# Patient Record
Sex: Female | Born: 1960 | Race: White | Hispanic: No | Smoking: Never smoker
Health system: Southern US, Community
[De-identification: ages and names within clinical notes are randomized; demographics above are authoritative.]

## PROBLEM LIST (undated history)

## (undated) DIAGNOSIS — K219 Gastro-esophageal reflux disease without esophagitis: Secondary | ICD-10-CM

## (undated) DIAGNOSIS — F419 Anxiety disorder, unspecified: Secondary | ICD-10-CM

## (undated) DIAGNOSIS — F32A Depression, unspecified: Secondary | ICD-10-CM

## (undated) HISTORY — DX: Depression, unspecified: F32.A

## (undated) HISTORY — PX: TUBAL LIGATION: SHX77

## (undated) HISTORY — DX: Anxiety disorder, unspecified: F41.9

---

## 2006-04-18 DIAGNOSIS — N959 Unspecified menopausal and perimenopausal disorder: Secondary | ICD-10-CM | POA: Insufficient documentation

## 2006-04-18 DIAGNOSIS — Z7289 Other problems related to lifestyle: Secondary | ICD-10-CM | POA: Insufficient documentation

## 2006-04-18 DIAGNOSIS — Z789 Other specified health status: Secondary | ICD-10-CM | POA: Insufficient documentation

## 2008-04-18 DIAGNOSIS — F4320 Adjustment disorder, unspecified: Secondary | ICD-10-CM | POA: Insufficient documentation

## 2011-04-03 ENCOUNTER — Ambulatory Visit: Payer: Self-pay | Admitting: Obstetrics & Gynecology

## 2011-04-03 LAB — CBC
HGB: 12.9 g/dL (ref 12.0–16.0)
MCH: 28.6 pg (ref 26.0–34.0)
MCV: 88 fL (ref 80–100)
RBC: 4.52 10*6/uL (ref 3.80–5.20)

## 2011-04-12 ENCOUNTER — Ambulatory Visit: Payer: Self-pay | Admitting: Obstetrics & Gynecology

## 2011-04-13 LAB — HEMOGLOBIN: HGB: 11.9 g/dL — ABNORMAL LOW (ref 12.0–16.0)

## 2011-04-15 LAB — PATHOLOGY REPORT

## 2011-05-03 HISTORY — PX: ABDOMINAL HYSTERECTOMY: SHX81

## 2012-04-17 ENCOUNTER — Ambulatory Visit: Payer: Self-pay | Admitting: Unknown Physician Specialty

## 2012-05-05 ENCOUNTER — Other Ambulatory Visit: Payer: Self-pay | Admitting: *Deleted

## 2012-05-05 DIAGNOSIS — Q438 Other specified congenital malformations of intestine: Secondary | ICD-10-CM

## 2012-05-05 DIAGNOSIS — K5730 Diverticulosis of large intestine without perforation or abscess without bleeding: Secondary | ICD-10-CM

## 2014-05-30 ENCOUNTER — Other Ambulatory Visit: Payer: Self-pay | Admitting: Gastroenterology

## 2014-05-30 DIAGNOSIS — Q438 Other specified congenital malformations of intestine: Secondary | ICD-10-CM

## 2014-05-30 DIAGNOSIS — K635 Polyp of colon: Secondary | ICD-10-CM

## 2014-05-31 HISTORY — PX: COLONOSCOPY: SHX174

## 2014-06-15 ENCOUNTER — Encounter (INDEPENDENT_AMBULATORY_CARE_PROVIDER_SITE_OTHER): Payer: Self-pay

## 2014-06-15 ENCOUNTER — Ambulatory Visit
Admission: RE | Admit: 2014-06-15 | Discharge: 2014-06-15 | Disposition: A | Discharge status: Home / Self Care | Payer: BLUE CROSS/BLUE SHIELD | Source: Ambulatory Visit | Attending: Gastroenterology | Admitting: Gastroenterology

## 2014-06-15 DIAGNOSIS — Q438 Other specified congenital malformations of intestine: Secondary | ICD-10-CM

## 2014-06-15 DIAGNOSIS — K635 Polyp of colon: Secondary | ICD-10-CM

## 2014-07-24 NOTE — Op Note (Signed)
PATIENT NAME:  Kayla Washington, Kayla Washington MR#:  [id number] DATE OF BIRTH:  1960/05/17  DATE OF PROCEDURE:  04/12/2011  PREOPERATIVE DIAGNOSES:  1. Fibroid uterus.  2. Left dermoid cyst of the ovary.  3. Cervical dysplasia.  4. Irregular periods. 5. Pelvic pain.   POSTOPERATIVE DIAGNOSES:  1. Fibroid uterus.  2. Left dermoid cyst of the ovary.  3. Cervical dysplasia.  4. Irregular periods. 5. Pelvic pain.   PROCEDURES PERFORMED:  1. Complete laparoscopic hysterectomy, left salpingo-oophorectomy.  2. Lysis of adhesions.   SURGEON: Dierdre Searles, MD  ASSISTANT: Dr. Patton Salles  ANESTHESIA: General by Dr. Pernell Dupre.   ESTIMATED BLOOD LOSS: 25 mL.   COMPLICATIONS: None.   FINDINGS: Patient has a left dermoid cyst. The right ovary was normal. The appendix was normal. There were minimal adhesions but there were some adhesions of the left adnexa, colon and side wall.    DISPOSITION: To recovery room in stable condition.   TECHNIQUE: Patient is prepped and draped in the usual sterile fashion after adequate anesthesia is obtained in the dorsal lithotomy position. A Foley catheter is inserted. A regular V-care device is placed on the cervix and the uterus is sounded to 6 cm.   Attention is then turned to the abdomen where a Veress needle is inserted after Marcaine 0.5% is used to anesthetize the skin just inferior to the umbilicus. Veress needle placement is confirmed using the hanging drop technique and the abdomen is then insufflated with CO2 gas. A 5 mm trocar is then inserted under direct visualization with the laparoscope with no injuries or bleeding noted. Patient is placed in Trendelenburg positioning and the above-mentioned findings are visualized.   An 11 mm trocar is placed in the right lower quadrant and a 5 mm trocar is placed in the left lower quadrant lateral to the inferior epigastric blood vessels with no injuries or bleeding noted. The uterus is grasped with a tenaculum. The right  uterine ovarian blood vessels and ligaments are carefully coagulated and cut with preservation of the main blood supply to the right ovary. Dissection is carried down to the level of the uterine arteries, which are carefully coagulated using bipolar cautery device and incised. On the left side, the adhesions are carefully dissected using the Harmonic scalpel. The infundibulopelvic blood vessel ligaments are carefully coagulated and cut in order to completely remove the left tube and ovary along with the uterine specimen. Dissection is carried down to the level of the uterine arteries, which are carefully coagulated using bipolar cautery device. The V-care device is identified through the cervical vaginal tissues at that junction and carefully incised in a circumferential manner until the uterus is completely amputated. It is removed vaginally.   The vaginal cuff is then closed with a Polysorb suture using the Endo Stitch device in an interrupted fashion, five sutures are placed and bimanual examination reveals adequate and complete closure with no loss of pneumoperitoneum and no defects. The pelvic cavity is irrigated with aspiration of all fluid. Excellent hemostasis is noted.   Patient is leveled, gas is expelled. Trocars are removed. Skin incisions are clean. 2-0 Vicryl sutures placed in the right lower quadrant incision to assist with hemostasis. The skin is closed with Dermabond. Bandages are applied. Patient goes to recovery room in stable condition. All sponge, instrument, and needle counts are correct.  ____________________________ R. Annamarie Major, MD rph:cms D: 04/12/2011 10:31:50 ET T: 04/12/2011 13:33:38 ET  JOB#: [id number] cc: Dierdre Searles, MD, <Dictator> Symphani Eckstrom P Abbott Jasinski  MD ELECTRONICALLY SIGNED 04/13/2011 23:06

## 2014-10-11 ENCOUNTER — Encounter: Payer: Self-pay | Admitting: Family Medicine

## 2014-10-11 ENCOUNTER — Ambulatory Visit (INDEPENDENT_AMBULATORY_CARE_PROVIDER_SITE_OTHER): Payer: BLUE CROSS/BLUE SHIELD | Admitting: Family Medicine

## 2014-10-11 VITALS — BP 110/72 | HR 88 | Temp 97.9°F | Resp 16 | Ht 62.0 in | Wt 174.0 lb

## 2014-10-11 DIAGNOSIS — B977 Papillomavirus as the cause of diseases classified elsewhere: Secondary | ICD-10-CM | POA: Insufficient documentation

## 2014-10-11 DIAGNOSIS — M1990 Unspecified osteoarthritis, unspecified site: Secondary | ICD-10-CM | POA: Diagnosis not present

## 2014-10-11 DIAGNOSIS — F32A Depression, unspecified: Secondary | ICD-10-CM | POA: Insufficient documentation

## 2014-10-11 DIAGNOSIS — F419 Anxiety disorder, unspecified: Secondary | ICD-10-CM | POA: Insufficient documentation

## 2014-10-11 MED ORDER — DOXYCYCLINE HYCLATE 100 MG PO TABS: 100.0000 mg | ORAL_TABLET | Freq: Two times a day (BID) | ORAL | Status: DC

## 2014-10-11 NOTE — Progress Notes (Signed)
Subjective:    Patient ID: Kayla Washington, female    DOB: 1960-05-22, 54 y.o.   MRN: [id number]  HPI  Joint/Muscle Pain: Patient complains of arthralgias for which has been present for 3 days. Pain is located in both knee(s) and both ankle(s), is described as aching and tingling, and is constant .  Associated symptoms include: decreased range of motion and warmth.  The patient has tried NSAIDs for pain, with minimal relief.  Related to injury:   no. Pt states she "pulled a tick off" of herself on 06/30. Pt also reports she has experienced fatigue and diarrhea. Denies fever.  Has not had symptoms like this before.  Atypical for her.   Review of Systems  Constitutional: Positive for activity change and fatigue. Negative for fever.  Respiratory: Negative for cough and shortness of breath.   Cardiovascular: Negative for chest pain, palpitations and leg swelling.  Gastrointestinal: Positive for diarrhea.  Musculoskeletal: Positive for myalgias and arthralgias. Negative for back pain, joint swelling, gait problem, neck pain and neck stiffness.   BP 110/72 mmHg  Pulse 88  Temp(Src) 97.9 F (36.6 C) (Oral)  Resp 16  Ht  (1.575 m)  Wt 174 lb (78.926 kg)  BMI 31.82 kg/m2   Patient Active Problem List   Diagnosis Date Noted  . Anxiety 10/11/2014  . 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 Alcohol drinker 04/18/2006  . Menopausal and perimenopausal disorder 04/18/2006   No past medical history on file. No current outpatient prescriptions on file prior to visit.   No current facility-administered medications on file prior to visit.   No Known Allergies Past Surgical History  Procedure Laterality Date  . Abdominal hysterectomy  05/2011    Still has left ovary;  Dr. Arvil Chaco  . Tubal ligation    . Colonoscopy  05/2014   History   Social History  . Marital Status: Single    Spouse Name: N/A  . Number of Children: 2  .  Years of Education: H/S   Occupational History  . 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 Smoking status: Never Smoker   . Smokeless tobacco: Never Used  . Alcohol Use: Yes     Comment: Occasional  . Drug Use: No  . Sexual Activity: Not on file   Other Topics Concern  . Not on file   Social History Narrative   Family History  Problem Relation Age of Onset  . Diabetes Mother   . Colon polyps Mother   . Heart attack Father   . Throat cancer Father   . Healthy Brother   . Hypertension Other   . Hyperlipidemia Other   . Heart disease Other   . Diabetes Other       Objective:   Physical Exam  Constitutional: She is oriented to person, place, and time. She appears well-developed and well-nourished.  Cardiovascular: Normal rate and regular rhythm.   Pulmonary/Chest: Effort normal and breath sounds normal.  Musculoskeletal: Normal range of motion. She exhibits no edema or tenderness.  Neurological: She is alert and oriented to person, place, and time.    BP 110/72 mmHg  Pulse 88  Temp(Src) 97.9 F (36.6 C) (Oral)  Resp 16  Ht  (1.575 m)  Wt 174 lb (78.926 kg)  BMI 31.82 kg/m2      Assessment & Plan:  1. Arthritis Very concerned about  Lyme Disease. Just does not feel right. Will treat presumptively.  Please call back if condition worsens or does not continue to improve.     Will treat with Doxycyline for 2 weeks.

## 2014-11-04 ENCOUNTER — Ambulatory Visit: Payer: BLUE CROSS/BLUE SHIELD | Admitting: Family Medicine

## 2014-11-04 ENCOUNTER — Encounter: Payer: Self-pay | Admitting: Family Medicine

## 2014-11-04 ENCOUNTER — Ambulatory Visit (INDEPENDENT_AMBULATORY_CARE_PROVIDER_SITE_OTHER): Payer: BLUE CROSS/BLUE SHIELD | Admitting: Family Medicine

## 2014-11-04 VITALS — BP 120/80 | HR 76 | Temp 98.3°F | Resp 16 | Wt 171.0 lb

## 2014-11-04 DIAGNOSIS — M1990 Unspecified osteoarthritis, unspecified site: Secondary | ICD-10-CM | POA: Insufficient documentation

## 2014-11-04 DIAGNOSIS — R0602 Shortness of breath: Secondary | ICD-10-CM | POA: Diagnosis not present

## 2014-11-04 DIAGNOSIS — R609 Edema, unspecified: Secondary | ICD-10-CM

## 2014-11-04 MED ORDER — HYDROCHLOROTHIAZIDE 12.5 MG PO TABS: 12.5000 mg | ORAL_TABLET | Freq: Every day | ORAL | Status: DC

## 2014-11-04 NOTE — Progress Notes (Signed)
Subjective:    Patient ID: Kayla Washington, female    DOB: [birth year], 54 y.o.   MRN: [id number]  HPI  Joint/Muscle Pain: Patient complains of arthralgias for which has been present for 3 weeks. Pain is located in multiple joints and both ankle(s), is described as aching, throbbing and tight band, and is moderate .  Associated symptoms include: edema, erythema, tenderness and warmth.  The patient has tried NSAIDs for pain, with minimal relief.  Related to injury:   no. Pt was recently seen for tick bite. Pt was put on Doxy for possible Lyme Disease due to arthralgias. Pt admits she did not finish the Doxy. Pt is now experiencing edema and erythema. Meds did make her sick. No better.  Feels like her knees and ankles and toes feel bad.   Review of Systems  Constitutional: Positive for fatigue. Negative for fever, chills, diaphoresis, activity change, appetite change and unexpected weight change.  Respiratory: Negative for cough, chest tightness, shortness of breath and wheezing.   Cardiovascular: Positive for leg swelling. Negative for chest pain and palpitations.  Musculoskeletal: Positive for joint swelling and arthralgias.  Skin: Positive for color change.   BP 120/80 mmHg  Pulse 76  Temp(Src) 98.3 F (36.8 C) (Oral)  Resp 16  Wt 171 lb (77.565 kg)   Patient Active Problem List   Diagnosis Date Noted  . Anxiety 10/11/2014  . 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 Alcohol drinker 04/18/2006  . Menopausal and perimenopausal disorder 04/18/2006   No past medical history on file. Current Outpatient Prescriptions on File Prior to Visit  Medication Sig  . famotidine (PEPCID AC MAXIMUM STRENGTH) 20 MG tablet Take by mouth.  . Omega-3 Fatty Acids (FISH OIL) 1200 MG CAPS Take by mouth.  . sertraline (ZOLOFT) 100 MG tablet Take 50 mg by mouth daily.   Marland Kitchen doxycycline (VIBRA-TABS) 100 MG tablet Take 1 tablet (100 mg total) by mouth 2 (two)  times daily. (Patient not taking: Reported on 11/04/2014)   No current facility-administered medications on file prior to visit.   No Known Allergies Past Surgical History  Procedure Laterality Date  . Abdominal hysterectomy  05/2011    Still has left ovary;  Dr. Ammie Dalton  . Tubal ligation    . Colonoscopy  05/2014   History   Social History  . Marital Status: Single    Spouse Name: N/A  . Number of Children: 2  . Years of Education: H/S   Occupational History  . 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 Smoking status: Never Smoker   . Smokeless tobacco: Never Used  . Alcohol Use: Yes     Comment: Occasional  . Drug Use: No  . Sexual Activity: Not on file   Other Topics Concern  . Not on file   Social History Narrative   Family History  Problem Relation Age of Onset  . Diabetes Mother   . Colon polyps Mother   . Heart attack Father   . Throat cancer Father   . Healthy Brother   . Hypertension Other   . Hyperlipidemia Other   . Heart disease Other   . Diabetes Other       Objective:   Physical Exam  Constitutional: She is oriented to person, place, and time. She appears well-developed and well-nourished.  Cardiovascular: Normal rate and regular rhythm.   Pulmonary/Chest: Effort normal  and breath sounds normal.  Musculoskeletal: Normal range of motion. She exhibits edema (in lower extremities below knee bilaterally.  ).  Neurological: She is alert and oriented to person, place, and time.  Psychiatric: She has a normal mood and affect. Her behavior is normal. Judgment and thought content normal.  BP 120/80 mmHg  Pulse 76  Temp(Src) 98.3 F (36.8 C) (Oral)  Resp 16  Wt 171 lb (77.565 kg)     Assessment & Plan:  1. Arthritis Did not finish medication for possible lyme secondary to side effects.  Will check labs and make further plan pending these results.  - Sed Rate (ESR) - Comprehensive metabolic panel -  Rheumatoid Factor - CYCLIC CITRUL PEPTIDE ANTIBODY, IGG/IGA - B. Burgdorfi Antibodies  2. Edema Unchanged. Will check labs pending these results. Will also start HCTZ.  - CBC with Differential/Platelet - D-Dimer, Quantitative - B Nat Peptide  3. Shortness of breath Check labs. Further plan pending these results.  - D-Dimer, Quantitative - B Nat Peptide  Margarita Rana, MD

## 2014-11-06 LAB — COMPREHENSIVE METABOLIC PANEL
ALK PHOS: 108 IU/L (ref 39–117)
ALT: 25 IU/L (ref 0–32)
AST: 22 IU/L (ref 0–40)
Albumin/Globulin Ratio: 2 (ref 1.1–2.5)
Albumin: 4.6 g/dL (ref 3.5–5.5)
BILIRUBIN TOTAL: 0.2 mg/dL (ref 0.0–1.2)
BUN/Creatinine Ratio: 16 (ref 9–23)
BUN: 12 mg/dL (ref 6–24)
CALCIUM: 9.9 mg/dL (ref 8.7–10.2)
CO2: 28 mmol/L (ref 18–29)
Chloride: 98 mmol/L (ref 97–108)
Creatinine, Ser: 0.73 mg/dL (ref 0.57–1.00)
GFR calc Af Amer: 108 mL/min/{1.73_m2} (ref >59)
GFR calc non Af Amer: 94 mL/min/{1.73_m2} (ref >59)
GLOBULIN, TOTAL: 2.3 g/dL (ref 1.5–4.5)
GLUCOSE: 95 mg/dL (ref 65–99)
Potassium: 4.1 mmol/L (ref 3.5–5.2)
SODIUM: 140 mmol/L (ref 134–144)
Total Protein: 6.9 g/dL (ref 6.0–8.5)

## 2014-11-06 LAB — CBC WITH DIFFERENTIAL/PLATELET
BASOS ABS: 0 10*3/uL (ref 0.0–0.2)
Basos: 0 %
EOS (ABSOLUTE): 0.2 10*3/uL (ref 0.0–0.4)
EOS: 3 %
Hematocrit: 37.9 % (ref 34.0–46.6)
Hemoglobin: 12.6 g/dL (ref 11.1–15.9)
Immature Grans (Abs): 0 10*3/uL (ref 0.0–0.1)
Immature Granulocytes: 0 %
Lymphocytes Absolute: 1.8 10*3/uL (ref 0.7–3.1)
Lymphs: 24 %
MCH: 27.9 pg (ref 26.6–33.0)
MCHC: 33.2 g/dL (ref 31.5–35.7)
MCV: 84 fL (ref 79–97)
Monocytes Absolute: 0.7 10*3/uL (ref 0.1–0.9)
Monocytes: 9 %
NEUTROS ABS: 4.9 10*3/uL (ref 1.4–7.0)
Neutrophils: 64 %
Platelets: 353 10*3/uL (ref 150–379)
RBC: 4.52 x10E6/uL (ref 3.77–5.28)
RDW: 14.3 % (ref 12.3–15.4)
WBC: 7.7 10*3/uL (ref 3.4–10.8)

## 2014-11-06 LAB — D-DIMER, QUANTITATIVE: D-DIMER: 1.42 mg/L FEU — ABNORMAL HIGH (ref 0.00–0.49)

## 2014-11-06 LAB — SEDIMENTATION RATE: SED RATE: 11 mm/h (ref 0–40)

## 2014-11-06 LAB — B. BURGDORFI ANTIBODIES

## 2014-11-06 LAB — BRAIN NATRIURETIC PEPTIDE: BNP: 8.4 pg/mL (ref 0.0–100.0)

## 2014-11-06 LAB — RHEUMATOID FACTOR: Rhuematoid fact SerPl-aCnc: 8.3 IU/mL (ref 0.0–13.9)

## 2014-11-06 LAB — CYCLIC CITRUL PEPTIDE ANTIBODY, IGG/IGA: CYCLIC CITRULLIN PEPTIDE AB: 5 U (ref 0–19)

## 2014-11-07 ENCOUNTER — Other Ambulatory Visit: Payer: Self-pay | Admitting: Obstetrics & Gynecology

## 2014-11-07 ENCOUNTER — Ambulatory Visit
Admission: RE | Admit: 2014-11-07 | Discharge: 2014-11-07 | Disposition: A | Discharge status: Home / Self Care | Payer: BLUE CROSS/BLUE SHIELD | Source: Ambulatory Visit | Attending: Family Medicine | Admitting: Family Medicine

## 2014-11-07 ENCOUNTER — Telehealth: Payer: Self-pay | Admitting: Family Medicine

## 2014-11-07 DIAGNOSIS — R609 Edema, unspecified: Secondary | ICD-10-CM | POA: Diagnosis not present

## 2014-11-07 NOTE — Telephone Encounter (Signed)
Pt advised.   Thanks,   -Nykolas Bacallao  

## 2014-11-07 NOTE — Addendum Note (Signed)
Addended by: Leo Grosser on: 11/07/2014 09:12 AM   Modules accepted: Orders

## 2014-11-07 NOTE — Telephone Encounter (Signed)
Pt called saying she went for the [place] on her leg which was neg. But is still having sweeling in her feet.  Please advise.  Thanks, [employer]

## 2014-11-07 NOTE — Telephone Encounter (Signed)
Needs to continue fluid pill, stay well hydrated, elevate as tolerated.  Call if does not improve and can write rx  For prescription support hose, but they can get really hot in the summer. Thanks.

## 2014-11-17 ENCOUNTER — Telehealth: Payer: Self-pay | Admitting: Family Medicine

## 2014-11-17 DIAGNOSIS — R609 Edema, unspecified: Secondary | ICD-10-CM

## 2014-11-17 NOTE — Telephone Encounter (Signed)
See below please. 

## 2014-11-17 NOTE — Telephone Encounter (Signed)
This is a Dr. Elease Hashimoto pt and pt is aware Dr. Elease Hashimoto is out of the office this week. Pt stated that she has been taking hydrochlorothiazide (HYDRODIURIL) 12.5 MG tablet for her feet swelling. Pt stated that she will have been on the medication for 2 weeks tomorrow and it hasn't helped. Pt stated that she has been elevating her legs but they are still swollen and hurt. Pt stated that she hasn't been able to wear the support hose because its too hot. Pt wanted to know if she should try something different. [facility]acy: [facility]. Please advise. Thanks TNP

## 2014-11-18 MED ORDER — FUROSEMIDE 20 MG PO TABS: 20.0000 mg | ORAL_TABLET | Freq: Every day | ORAL | Status: DC | PRN

## 2014-11-18 NOTE — Telephone Encounter (Signed)
Stop HCTZ and you can call in furosemide 20 mg every morning as needed, #15 with no refills. She needs to follow-up with Dr. Elease Hashimoto in the next 2-3 weeks to see how this problem is.

## 2014-11-18 NOTE — Telephone Encounter (Signed)
Left message to call back.  Thanks, 

## 2014-11-18 NOTE — Telephone Encounter (Signed)
Sent prescription to pharmacy as directed below.  Thanks,

## 2014-11-18 NOTE — Telephone Encounter (Signed)
Advised pt as directed below, pt verbalized fully understanding. Pt requested for the prescription to be sent to [facility] [address] in [place],  Thanks,

## 2014-11-24 ENCOUNTER — Telehealth: Payer: Self-pay | Admitting: Family Medicine

## 2014-11-24 ENCOUNTER — Ambulatory Visit (INDEPENDENT_AMBULATORY_CARE_PROVIDER_SITE_OTHER): Payer: BLUE CROSS/BLUE SHIELD | Admitting: Family Medicine

## 2014-11-24 ENCOUNTER — Encounter: Payer: Self-pay | Admitting: Family Medicine

## 2014-11-24 VITALS — BP 124/76 | HR 68 | Temp 98.0°F | Resp 16 | Wt 170.0 lb

## 2014-11-24 DIAGNOSIS — R609 Edema, unspecified: Secondary | ICD-10-CM | POA: Diagnosis not present

## 2014-11-24 MED ORDER — FUROSEMIDE 40 MG PO TABS: 40.0000 mg | ORAL_TABLET | Freq: Every day | ORAL | Status: DC

## 2014-11-24 MED ORDER — POTASSIUM CHLORIDE CRYS ER 20 MEQ PO TBCR: 20.0000 meq | EXTENDED_RELEASE_TABLET | Freq: Every day | ORAL | Status: DC

## 2014-11-24 NOTE — Progress Notes (Signed)
Patient ID: Kayla Washington, female   DOB: 08/01/1960, 54 y.o.   MRN: [id number]        Patient: Kayla Washington Female    DOB: September 29, 1960   54 y.o.   MRN: [id number] Visit Date: 11/24/2014  Today's Provider: Lorie Phenix, MD   Chief Complaint  Patient presents with  . Edema   Subjective:    HPI  Edema: Patient complains of edema. The location of the edema is lower leg(s) bilateral.  The edema has been off and on.  Onset of symptoms was 2 months ago, unchanged since that time. The edema is present in the evening.  The swelling has been aggravated by rest, relieved by nothing, and been associated with nothing. Cardiac risk factors include none.  Pt reported last week she stopped the HCTZ and started Lasix  a day.  She reports the medication change has helped some, but is still having a lot of trouble with her feet swelling, pain and stiffness.  This occurs especially after working when she is home resting.         No Known Allergies Previous Medications   FAMOTIDINE (PEPCID AC MAXIMUM STRENGTH) 20 MG TABLET    Take by mouth.   FUROSEMIDE (LASIX) 20 MG TABLET    Take 1 tablet (20 mg total) by mouth daily as needed.   MULTIPLE VITAMINS-MINERALS (CENTRUM SILVER ULTRA WOMENS PO)    Take by mouth.   OMEGA-3 FATTY ACIDS (FISH OIL) 1200 MG CAPS    Take by mouth.   SERTRALINE (ZOLOFT) 100 MG TABLET    Take 50 mg by mouth daily.     Review of Systems  Constitutional: Negative.   Respiratory: Negative.   Cardiovascular: Positive for leg swelling. Negative for chest pain and palpitations.  Musculoskeletal: Positive for myalgias (Muscle pain and stiffness.), joint swelling and arthralgias. Negative for back pain, gait problem, neck pain and neck stiffness.    Social History  Substance Use Topics  . Smoking status: Never Smoker   . Smokeless tobacco: Never Used  . Alcohol Use: Yes     Comment: Occasional   Objective:   BP 124/76 mmHg  Pulse 68  Temp(Src) 98 F (36.7 C)  (Oral)  Resp 16  Wt 170 lb (77.111 kg)  Physical Exam  Constitutional: She is oriented to person, place, and time. She appears well-developed and well-nourished.  Cardiovascular: Normal rate and regular rhythm.   Pulmonary/Chest: Effort normal and breath sounds normal.  Musculoskeletal: She exhibits edema (trace).  Neurological: She is alert and oriented to person, place, and time.      Assessment & Plan:     1. Edema Not really improved. Will increase Lasix, check labs, and add potassium.   Consider Zaroxolyn if this does not work. Will consider support hose when cooler.  Recheck in 4 weeks.  - potassium chloride SA (K-DUR,KLOR-CON) 20 MEQ tablet; Take 1 tablet (20 mEq total) by mouth daily.  Dispense: 30 tablet; Refill: 3 - furosemide (LASIX) 40 MG tablet; Take 1 tablet (40 mg total) by mouth daily.  Dispense: 30 tablet; Refill: 3 - Comprehensive metabolic panel  Lorie Phenix, MD        Lorie Phenix, MD  [facility]

## 2014-11-25 ENCOUNTER — Telehealth: Payer: Self-pay

## 2014-11-25 LAB — COMPREHENSIVE METABOLIC PANEL
ALT: 33 IU/L — ABNORMAL HIGH (ref 0–32)
AST: 26 IU/L (ref 0–40)
Albumin/Globulin Ratio: 1.6 (ref 1.1–2.5)
Albumin: 4.1 g/dL (ref 3.5–5.5)
Alkaline Phosphatase: 112 IU/L (ref 39–117)
BUN/Creatinine Ratio: 13 (ref 9–23)
BUN: 10 mg/dL (ref 6–24)
Bilirubin Total: 0.3 mg/dL (ref 0.0–1.2)
CHLORIDE: 98 mmol/L (ref 97–108)
CO2: 27 mmol/L (ref 18–29)
Calcium: 9.8 mg/dL (ref 8.7–10.2)
Creatinine, Ser: 0.75 mg/dL (ref 0.57–1.00)
GFR, EST AFRICAN AMERICAN: 105 mL/min/{1.73_m2} (ref >59)
GFR, EST NON AFRICAN AMERICAN: 91 mL/min/{1.73_m2} (ref >59)
GLUCOSE: 95 mg/dL (ref 65–99)
Globulin, Total: 2.6 g/dL (ref 1.5–4.5)
Potassium: 4.7 mmol/L (ref 3.5–5.2)
Sodium: 141 mmol/L (ref 134–144)
TOTAL PROTEIN: 6.7 g/dL (ref 6.0–8.5)

## 2014-11-25 NOTE — Telephone Encounter (Signed)
Left message to call back  

## 2014-11-25 NOTE — Telephone Encounter (Signed)
Advised patient of labs 

## 2014-11-25 NOTE — Telephone Encounter (Signed)
-----   Message from Lorie Phenix, MD sent at 11/25/2014  9:58 AM EDT ----- Labs stable. Please notify patient. Thanks.

## 2014-12-22 ENCOUNTER — Ambulatory Visit (INDEPENDENT_AMBULATORY_CARE_PROVIDER_SITE_OTHER): Payer: BLUE CROSS/BLUE SHIELD | Admitting: Family Medicine

## 2014-12-22 ENCOUNTER — Encounter: Payer: Self-pay | Admitting: Family Medicine

## 2014-12-22 VITALS — BP 114/68 | HR 68 | Temp 98.2°F | Resp 16 | Wt 170.0 lb

## 2014-12-22 DIAGNOSIS — R609 Edema, unspecified: Secondary | ICD-10-CM | POA: Diagnosis not present

## 2014-12-22 NOTE — Progress Notes (Signed)
Patient ID: Kayla Washington, female   DOB: Sep 22, 1960, 54 y.o.   MRN: [id number]        Patient: Kayla Washington Female    DOB: Apr 11, 1960   54 y.o.   MRN: [id number] Visit Date: 12/22/2014  Today's Provider: Lorie Phenix, MD   Chief Complaint  Patient presents with  . Edema   Subjective:    HPI  Edema: Patient complains of edema. The location of the edema is lower leg(s) bilateral.  The edema has been moderate.  Onset of symptoms was a few months ago, completely resolved since that time. The edema is present in the evening. The patient states never.  The swelling has been aggravated by hot weather and standing at work, and steel toes shoes. , relieved by diuretics, and been associated with nothing. Cardiac risk factors include none.      No Known Allergies Previous Medications   FAMOTIDINE (PEPCID AC MAXIMUM STRENGTH) 20 MG TABLET    Take by mouth.   FUROSEMIDE (LASIX) 40 MG TABLET    Take 1 tablet (40 mg total) by mouth daily.   MULTIPLE VITAMINS-MINERALS (CENTRUM SILVER ULTRA WOMENS PO)    Take by mouth.   OMEGA-3 FATTY ACIDS (FISH OIL) 1200 MG CAPS    Take by mouth.   POTASSIUM CHLORIDE SA (K-DUR,KLOR-CON) 20 MEQ TABLET    Take 1 tablet (20 mEq total) by mouth daily.   SERTRALINE (ZOLOFT) 100 MG TABLET    Take 50 mg by mouth daily.     Review of Systems  Constitutional: Negative.   Respiratory: Negative.   Cardiovascular: Negative.   Musculoskeletal: Negative.   Neurological: Negative.     Social History  Substance Use Topics  . Smoking status: Never Smoker   . Smokeless tobacco: Never Used  . Alcohol Use: Yes     Comment: Occasional   Objective:   BP 114/68 mmHg  Pulse 68  Temp(Src) 98.2 F (36.8 C) (Oral)  Resp 16  Wt 170 lb (77.111 kg)  Physical Exam  Constitutional: She is oriented to person, place, and time. She appears well-developed and well-nourished.  Musculoskeletal: Normal range of motion. She exhibits no edema.  Neurological: She is  alert and oriented to person, place, and time.      Assessment & Plan:        1. Edema Continue medication for now. Change to PRN when weather changes. Check labs.   - Comprehensive metabolic panel  Lorie Phenix, MD  [facility]

## 2014-12-23 ENCOUNTER — Telehealth: Payer: Self-pay

## 2014-12-23 LAB — COMPREHENSIVE METABOLIC PANEL
ALT: 28 IU/L (ref 0–32)
AST: 23 IU/L (ref 0–40)
Albumin/Globulin Ratio: 1.5 (ref 1.1–2.5)
Albumin: 4.2 g/dL (ref 3.5–5.5)
Alkaline Phosphatase: 102 IU/L (ref 39–117)
BUN/Creatinine Ratio: 18 (ref 9–23)
BUN: 13 mg/dL (ref 6–24)
Bilirubin Total: 0.3 mg/dL (ref 0.0–1.2)
CALCIUM: 9.8 mg/dL (ref 8.7–10.2)
CO2: 27 mmol/L (ref 18–29)
CREATININE: 0.74 mg/dL (ref 0.57–1.00)
Chloride: 97 mmol/L (ref 97–108)
GFR calc Af Amer: 106 mL/min/{1.73_m2} (ref >59)
GFR, EST NON AFRICAN AMERICAN: 92 mL/min/{1.73_m2} (ref >59)
Globulin, Total: 2.8 g/dL (ref 1.5–4.5)
Glucose: 92 mg/dL (ref 65–99)
Potassium: 3.7 mmol/L (ref 3.5–5.2)
Sodium: 140 mmol/L (ref 134–144)
Total Protein: 7 g/dL (ref 6.0–8.5)

## 2014-12-23 NOTE — Telephone Encounter (Signed)
-----   Message from Lorie Phenix, MD sent at 12/23/2014  9:42 AM EDT ----- Labs normal.  Thanks.

## 2014-12-23 NOTE — Telephone Encounter (Signed)
Pt informed and voiced understanding of results. 

## 2014-12-23 NOTE — Telephone Encounter (Signed)
LMTCB Emily Drozdowski, CMA  

## 2015-04-10 ENCOUNTER — Encounter: Payer: Self-pay | Admitting: Family Medicine

## 2015-10-09 ENCOUNTER — Other Ambulatory Visit: Payer: Self-pay | Admitting: Family Medicine

## 2015-10-09 DIAGNOSIS — F419 Anxiety disorder, unspecified: Secondary | ICD-10-CM

## 2015-10-20 ENCOUNTER — Ambulatory Visit (INDEPENDENT_AMBULATORY_CARE_PROVIDER_SITE_OTHER): Payer: BLUE CROSS/BLUE SHIELD | Admitting: Family Medicine

## 2015-10-20 ENCOUNTER — Encounter: Payer: Self-pay | Admitting: Family Medicine

## 2015-10-20 VITALS — BP 124/82 | HR 60 | Temp 98.0°F | Resp 14 | Wt 178.0 lb

## 2015-10-20 DIAGNOSIS — M7021 Olecranon bursitis, right elbow: Secondary | ICD-10-CM

## 2015-10-20 DIAGNOSIS — L723 Sebaceous cyst: Secondary | ICD-10-CM

## 2015-10-20 NOTE — Progress Notes (Signed)
Patient: Kayla Washington Female    DOB: 05/15/1960   55 y.o.   MRN: [id number] Visit Date: 10/20/2015  Today's Provider: Dortha Kern, PA   Chief Complaint  Patient presents with  . Fall  . Elbow Injury   Subjective:    HPI  Patient fell at home 1 month ago by slipping on her rug and landed on her elbows. Her right elbow and arm turned blue for 1 week and then resolved. She has developed a knot on her right elbow that hurts at times. Some warmth to the area at times. She has taking some Ibuprofen. 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  Patient Active Problem List   Diagnosis Date Noted  . Arthritis 11/04/2014  . Edema 11/04/2014  . Shortness of breath 11/04/2014  . Anxiety 10/11/2014  . 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 Alcohol drinker (HCC) 04/18/2006  . Menopausal and perimenopausal disorder 04/18/2006   Past Surgical History  Procedure Laterality Date  . Abdominal hysterectomy  05/2011    Still has left ovary;  Dr. Arvil Chaco  . Tubal ligation    . Colonoscopy  05/2014   Family History  Problem Relation Age of Onset  . Diabetes Mother   . Colon polyps Mother   . Heart attack Father   . Throat cancer Father   . Healthy Brother   . Hypertension Other   . Hyperlipidemia Other   . Heart disease Other   . Diabetes Other    No Known Allergies   Current Meds  Medication Sig  . famotidine (PEPCID AC MAXIMUM STRENGTH) 20 MG tablet Take by mouth.    Review of Systems  Constitutional: Positive for chills.  Respiratory: Negative.   Cardiovascular: Negative.   Musculoskeletal: Positive for myalgias, joint swelling and arthralgias.    Social History  Substance Use Topics  . Smoking status: Never Smoker   . Smokeless tobacco: Never Used  . Alcohol Use: Yes     Comment: Occasional   Objective:   BP 124/82 mmHg  Pulse 60   Temp(Src) 98 F (36.7 C)  Resp 14  Wt 178 lb (80.74 kg)  Wt Readings from Last 3 Encounters:  10/20/15 178 lb (80.74 kg)  12/22/14 170 lb (77.111 kg)  11/24/14 170 lb (77.111 kg)   Physical Exam  Constitutional: She is oriented to person, place, and time. She appears well-developed and well-nourished. No distress.  HENT:  Head: Normocephalic and atraumatic.  Right Ear: Hearing normal.  Left Ear: Hearing normal.  Nose: Nose normal.  Eyes: Conjunctivae and lids are normal. Right eye exhibits no discharge. Left eye exhibits no discharge. No scleral icterus.  Pulmonary/Chest: Effort normal. No respiratory distress.  Musculoskeletal: Normal range of motion.  Soft fluid filled bursa left elbow. No tenderness or redness. Full ROM and good pulses. No weakness.  Neurological: She is alert and oriented to person, place, and time.  Skin: Skin is intact. No lesion and no rash noted.  1 cm firm cyst right side of neck with yellow oily fluid expressed. Not red or tender. No local lymphadenopathy.  Psychiatric: She has a normal mood and affect. Her speech is normal and behavior is normal. Thought content normal.      Assessment & Plan:     1. Olecranon bursitis of right elbow Onset over the past week.  Job involves a great deal of repetitive motion and she has fallen onto this elbow a month ago. No pain or redness. Small amount of fluid in the bursa. May use Ibuprofen 200 mg 3 tablets TID with food and an elbow sleeve for compression. Recheck if no better in 7-10 days. May need aspiration.  2. Sebaceous cyst 1 cm cyst right side of neck without redness or local lymphadenopathy. Some yellow oily fluid expressed. No tenderness. Apply moist hot compresses. Recheck if worsening or signs of infection.       Dortha Kernennis Chrismon, PA  [facility]

## 2016-05-03 ENCOUNTER — Ambulatory Visit (INDEPENDENT_AMBULATORY_CARE_PROVIDER_SITE_OTHER): Payer: BLUE CROSS/BLUE SHIELD | Admitting: Family Medicine

## 2016-05-03 ENCOUNTER — Encounter: Payer: Self-pay | Admitting: Family Medicine

## 2016-05-03 VITALS — BP 128/82 | HR 78 | Temp 98.1°F | Resp 16 | Wt 181.4 lb

## 2016-05-03 DIAGNOSIS — R079 Chest pain, unspecified: Secondary | ICD-10-CM | POA: Diagnosis not present

## 2016-05-03 DIAGNOSIS — K219 Gastro-esophageal reflux disease without esophagitis: Secondary | ICD-10-CM | POA: Diagnosis not present

## 2016-05-03 DIAGNOSIS — R202 Paresthesia of skin: Secondary | ICD-10-CM

## 2016-05-03 NOTE — Progress Notes (Signed)
Patient: Kayla Washington Female    DOB: Dec 25, 1960   56 y.o.   MRN: [id number] Visit Date: 05/03/2016  Today's Provider: Dortha Kern, PA   Chief Complaint  Patient presents with  . Hypertension   Subjective:    Hypertension  This is a new problem. The current episode started in the past 7 days. Associated symptoms include blurred vision, chest pain (last week) and headaches. (Chest pain during the night last week. Coldness and tingling in left hand) There are no known risk factors for coronary artery disease. Past treatments include nothing.   Patient Active Problem List   Diagnosis Date Noted  . Arthritis 11/04/2014  . Edema 11/04/2014  . Shortness of breath 11/04/2014  . Anxiety 10/11/2014  . 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 Alcohol drinker 04/18/2006  . Menopausal and perimenopausal disorder 04/18/2006   Past Surgical History:  Procedure Laterality Date  . ABDOMINAL HYSTERECTOMY  05/2011   Still has left ovary;  Dr. Arvil Chaco  . COLONOSCOPY  05/2014  . TUBAL LIGATION     Family History  Problem Relation Age of Onset  . Diabetes Mother   . Colon polyps Mother   . Heart attack Father   . Throat cancer Father   . Healthy Brother   . Hypertension Other   . Hyperlipidemia Other   . Heart disease Other   . Diabetes Other    No Known Allergies   Previous Medications   FAMOTIDINE (PEPCID AC MAXIMUM STRENGTH) 20 MG TABLET    Take by mouth.    Review of Systems  Constitutional: Negative.   Eyes: Positive for blurred vision and visual disturbance.  Respiratory: Negative.   Cardiovascular: Positive for chest pain (last week).  Neurological: Positive for headaches.    Social History  Substance Use Topics  . Smoking status: Never Smoker  . Smokeless tobacco: Never Used  . Alcohol use Yes     Comment: Occasional   Objective:   BP 128/82 (BP Location: Right Arm, Patient Position: Sitting, Cuff Size:  Normal)   Pulse 78   Temp 98.1 F (36.7 C) (Oral)   Resp 16   Wt 181 lb 6.4 oz (82.3 kg)   SpO2 96%   BMI 33.18 kg/m   Physical Exam  Constitutional: She is oriented to person, place, and time. She appears well-developed and well-nourished. No distress.  HENT:  Head: Normocephalic and atraumatic.  Right Ear: Hearing and external ear normal.  Left Ear: Hearing and external ear normal.  Nose: Nose normal.  Mouth/Throat: Oropharynx is clear and moist.  Eyes: Conjunctivae and lids are normal. Right eye exhibits no discharge. Left eye exhibits no discharge. No scleral icterus.  Neck: Neck supple.  Cardiovascular: Normal rate and regular rhythm.   Pulmonary/Chest: Effort normal and breath sounds normal. No respiratory distress.  Abdominal: Soft. Bowel sounds are normal.  Musculoskeletal: Normal range of motion.  Lymphadenopathy:    She has no cervical adenopathy.  Neurological: She is alert and oriented to person, place, and time.  Skin: Skin is intact. No lesion and no rash noted.  Psychiatric: Her speech is normal and behavior is normal. Thought content normal. Her mood appears anxious.      Assessment & Plan:     1. Chest pain, unspecified type Onset 04-27-16 without dyspnea, diaphoresis or palpitations. Described as a sharp pain. Did not last very long and has not recurred this week. Not related to activities. Only happened  during that night. EKG essentially unchanged from tracing in 2014. Will check labs and if having recurrences, should schedule evaluation by cardiologist with stress test. Recommend she take a Baby ASA daily. - EKG 12-Lead - CBC with Differential/Platelet - Comprehensive metabolic panel - Lipid panel  2. Paresthesias in left hand Onset 04-30-16 with a "cold sensation" and feeling cold to touch. Seems to recur when she is at work doing a repetitive motion task. States there has been some extra stress at work with job changes and a new Solicitorplant manager. Denies  cyanosis or blanching of fingers. Normal pulses and sensation today. Negative tests for CTS. May have been a Raynaud's Phenomena. Recommend she keep the hands warm and exercise regularly. Check routine labs. May need neurology referral if symptoms persist. - CBC with Differential/Platelet - Comprehensive metabolic panel - TSH  3. Gastroesophageal reflux disease, esophagitis presence not specified Continues to have dyspepsia with acidic foods and some reflux at night. Need to get back on the Prilosec daily and recheck routine labs. May need referral to gastroenterologist if symptoms persist. - CBC with Differential/Platelet

## 2016-05-03 NOTE — Patient Instructions (Signed)
Heartburn Heartburn is a type of pain or discomfort that can happen in the throat or chest. It is often described as a burning pain. It may also cause a bad taste in the mouth. Heartburn may feel worse when you lie down or bend over, and it is often worse at night. Heartburn may be caused by stomach contents that move back up into the esophagus (reflux). Follow these instructions at home: Take these actions to decrease your discomfort and to help avoid complications. Diet  Follow a diet as recommended by your health care provider. This may involve avoiding foods and drinks such as: ? Coffee and tea (with or without caffeine). ? Drinks that contain alcohol. ? Energy drinks and sports drinks. ? Carbonated drinks or sodas. ? Chocolate and cocoa. ? Peppermint and mint flavorings. ? Garlic and onions. ? Horseradish. ? Spicy and acidic foods, including peppers, chili powder, curry powder, vinegar, hot sauces, and barbecue sauce. ? Citrus fruit juices and citrus fruits, such as oranges, lemons, and limes. ? Tomato-based foods, such as red sauce, chili, salsa, and pizza with red sauce. ? Fried and fatty foods, such as donuts, french fries, potato chips, and high-fat dressings. ? High-fat meats, such as hot dogs and fatty cuts of red and white meats, such as rib eye steak, sausage, ham, and bacon. ? High-fat dairy items, such as whole milk, butter, and cream cheese.  Eat small, frequent meals instead of large meals.  Avoid drinking large amounts of liquid with your meals.  Avoid eating meals during the 2-3 hours before bedtime.  Avoid lying down right after you eat.  Do not exercise right after you eat. General instructions  Pay attention to any changes in your symptoms.  Take over-the-counter and prescription medicines only as told by your health care provider. Do not take aspirin, ibuprofen, or other NSAIDs unless your health care provider told you to do so.  Do not use any tobacco  products, including cigarettes, chewing tobacco, and e-cigarettes. If you need help quitting, ask your health care provider.  Wear loose-fitting clothing. Do not wear anything tight around your waist that causes pressure on your abdomen.  Raise (elevate) the head of your bed about 6 inches (15 cm).  Try to reduce your stress, such as with yoga or meditation. If you need help reducing stress, ask your health care provider.  If you are overweight, reduce your weight to an amount that is healthy for you. Ask your health care provider for guidance about a safe weight loss goal.  Keep all follow-up visits as told by your health care provider. This is important. Contact a health care provider if:  You have new symptoms.  You have unexplained weight loss.  You have difficulty swallowing, or it hurts to swallow.  You have wheezing or a persistent cough.  Your symptoms do not improve with treatment.  You have frequent heartburn for more than two weeks. Get help right away if:  You have pain in your arms, neck, jaw, teeth, or back.  You feel sweaty, dizzy, or light-headed.  You have chest pain or shortness of breath.  You vomit and your vomit looks like blood or coffee grounds.  Your stool is bloody or black. This information is not intended to replace advice given to you by your health care provider. Make sure you discuss any questions you have with your health care provider. Document Released: 08/04/2008 Document Revised: 08/24/2015 Document Reviewed: 07/13/2014 Elsevier Interactive Patient Education  2017 Elsevier   Inc.  

## 2016-05-17 DIAGNOSIS — R748 Abnormal levels of other serum enzymes: Secondary | ICD-10-CM | POA: Diagnosis not present

## 2016-05-17 DIAGNOSIS — R202 Paresthesia of skin: Secondary | ICD-10-CM | POA: Diagnosis not present

## 2016-05-17 DIAGNOSIS — K219 Gastro-esophageal reflux disease without esophagitis: Secondary | ICD-10-CM | POA: Diagnosis not present

## 2016-05-17 DIAGNOSIS — R079 Chest pain, unspecified: Secondary | ICD-10-CM | POA: Diagnosis not present

## 2016-05-18 LAB — CBC WITH DIFFERENTIAL/PLATELET
BASOS: 1 %
Basophils Absolute: 0 10*3/uL (ref 0.0–0.2)
EOS (ABSOLUTE): 0.3 10*3/uL (ref 0.0–0.4)
Eos: 6 %
Hematocrit: 37.5 % (ref 34.0–46.6)
Hemoglobin: 12.9 g/dL (ref 11.1–15.9)
Immature Grans (Abs): 0 10*3/uL (ref 0.0–0.1)
Immature Granulocytes: 0 %
LYMPHS ABS: 1.4 10*3/uL (ref 0.7–3.1)
Lymphs: 31 %
MCH: 28.1 pg (ref 26.6–33.0)
MCHC: 34.4 g/dL (ref 31.5–35.7)
MCV: 82 fL (ref 79–97)
MONOS ABS: 0.4 10*3/uL (ref 0.1–0.9)
Monocytes: 10 %
NEUTROS ABS: 2.4 10*3/uL (ref 1.4–7.0)
Neutrophils: 52 %
Platelets: 294 10*3/uL (ref 150–379)
RBC: 4.59 x10E6/uL (ref 3.77–5.28)
RDW: 15.3 % (ref 12.3–15.4)
WBC: 4.6 10*3/uL (ref 3.4–10.8)

## 2016-05-18 LAB — LIPID PANEL
CHOL/HDL RATIO: 3.2 ratio (ref 0.0–4.4)
CHOLESTEROL TOTAL: 221 mg/dL — AB (ref 100–199)
HDL: 69 mg/dL (ref >39)
LDL Calculated: 134 mg/dL — ABNORMAL HIGH (ref 0–99)
TRIGLYCERIDES: 88 mg/dL (ref 0–149)
VLDL Cholesterol Cal: 18 mg/dL (ref 5–40)

## 2016-05-18 LAB — COMPREHENSIVE METABOLIC PANEL
A/G RATIO: 1.8 (ref 1.2–2.2)
ALK PHOS: 112 IU/L (ref 39–117)
ALT: 89 IU/L — ABNORMAL HIGH (ref 0–32)
AST: 56 IU/L — ABNORMAL HIGH (ref 0–40)
Albumin: 4.2 g/dL (ref 3.5–5.5)
BUN / CREAT RATIO: 16 (ref 9–23)
BUN: 11 mg/dL (ref 6–24)
Bilirubin Total: 0.4 mg/dL (ref 0.0–1.2)
CO2: 25 mmol/L (ref 18–29)
Calcium: 9.9 mg/dL (ref 8.7–10.2)
Chloride: 100 mmol/L (ref 96–106)
Creatinine, Ser: 0.68 mg/dL (ref 0.57–1.00)
GFR calc Af Amer: 113 mL/min/{1.73_m2} (ref >59)
GFR, EST NON AFRICAN AMERICAN: 98 mL/min/{1.73_m2} (ref >59)
GLOBULIN, TOTAL: 2.4 g/dL (ref 1.5–4.5)
Glucose: 99 mg/dL (ref 65–99)
POTASSIUM: 4.5 mmol/L (ref 3.5–5.2)
SODIUM: 141 mmol/L (ref 134–144)
Total Protein: 6.6 g/dL (ref 6.0–8.5)

## 2016-05-18 LAB — TSH: TSH: 2.13 u[IU]/mL (ref 0.450–4.500)

## 2016-05-19 ENCOUNTER — Encounter: Payer: Self-pay | Admitting: Family Medicine

## 2016-05-20 ENCOUNTER — Other Ambulatory Visit: Payer: Self-pay | Admitting: Emergency Medicine

## 2016-05-20 DIAGNOSIS — R748 Abnormal levels of other serum enzymes: Secondary | ICD-10-CM

## 2016-05-20 NOTE — Addendum Note (Signed)
Addended by: Latanya Presser'DELL, BRITTANY M on: 05/20/2016 09:57 AM   Modules accepted: Orders

## 2016-05-21 LAB — HEPATITIS PANEL, ACUTE
HEP A IGM: NEGATIVE
Hep B C IgM: NEGATIVE
Hepatitis B Surface Ag: NEGATIVE

## 2016-05-21 LAB — SPECIMEN STATUS REPORT

## 2016-05-22 NOTE — Progress Notes (Signed)
Advised  ED 

## 2017-06-09 ENCOUNTER — Ambulatory Visit (INDEPENDENT_AMBULATORY_CARE_PROVIDER_SITE_OTHER): Payer: BLUE CROSS/BLUE SHIELD | Admitting: Family Medicine

## 2017-06-09 ENCOUNTER — Encounter: Payer: Self-pay | Admitting: Family Medicine

## 2017-06-09 VITALS — BP 120/80 | HR 64 | Temp 98.0°F | Resp 16 | Ht 62.0 in | Wt 176.0 lb

## 2017-06-09 DIAGNOSIS — R87613 High grade squamous intraepithelial lesion on cytologic smear of cervix (HGSIL): Secondary | ICD-10-CM | POA: Diagnosis not present

## 2017-06-09 DIAGNOSIS — Z114 Encounter for screening for human immunodeficiency virus [HIV]: Secondary | ICD-10-CM

## 2017-06-09 DIAGNOSIS — Z0000 Encounter for general adult medical examination without abnormal findings: Secondary | ICD-10-CM | POA: Diagnosis not present

## 2017-06-09 DIAGNOSIS — Z23 Encounter for immunization: Secondary | ICD-10-CM

## 2017-06-09 DIAGNOSIS — R87612 Low grade squamous intraepithelial lesion on cytologic smear of cervix (LGSIL): Secondary | ICD-10-CM | POA: Insufficient documentation

## 2017-06-09 DIAGNOSIS — B977 Papillomavirus as the cause of diseases classified elsewhere: Secondary | ICD-10-CM

## 2017-06-09 DIAGNOSIS — Z1231 Encounter for screening mammogram for malignant neoplasm of breast: Secondary | ICD-10-CM

## 2017-06-09 DIAGNOSIS — N890 Mild vaginal dysplasia: Secondary | ICD-10-CM | POA: Insufficient documentation

## 2017-06-09 NOTE — Patient Instructions (Signed)
Preventive Care 40-64 Years, Female Preventive care refers to lifestyle choices and visits with your health care provider that can promote health and wellness. What does preventive care include?  A yearly physical exam. This is also called an annual well check.  Dental exams once or twice a year.  Routine eye exams. Ask your health care provider how often you should have your eyes checked.  Personal lifestyle choices, including: ? Daily care of your teeth and gums. ? Regular physical activity. ? Eating a healthy diet. ? Avoiding tobacco and drug use. ? Limiting alcohol use. ? Practicing safe sex. ? Taking low-dose aspirin daily starting at age 58. ? Taking vitamin and mineral supplements as recommended by your health care provider. What happens during an annual well check? The services and screenings done by your health care provider during your annual well check will depend on your age, overall health, lifestyle risk factors, and family history of disease. Counseling Your health care provider may ask you questions about your:  Alcohol use.  Tobacco use.  Drug use.  Emotional well-being.  Home and relationship well-being.  Sexual activity.  Eating habits.  Work and work Statistician.  Method of birth control.  Menstrual cycle.  Pregnancy history.  Screening You may have the following tests or measurements:  Height, weight, and BMI.  Blood pressure.  Lipid and cholesterol levels. These may be checked every 5 years, or more frequently if you are over 81 years old.  Skin check.  Lung cancer screening. You may have this screening every year starting at age 78 if you have a 30-pack-year history of smoking and currently smoke or have quit within the past 15 years.  Fecal occult blood test (FOBT) of the stool. You may have this test every year starting at age 65.  Flexible sigmoidoscopy or colonoscopy. You may have a sigmoidoscopy every 5 years or a colonoscopy  every 10 years starting at age 30.  Hepatitis C blood test.  Hepatitis B blood test.  Sexually transmitted disease (STD) testing.  Diabetes screening. This is done by checking your blood sugar (glucose) after you have not eaten for a while (fasting). You may have this done every 1-3 years.  Mammogram. This may be done every 1-2 years. Talk to your health care provider about when you should start having regular mammograms. This may depend on whether you have a family history of breast cancer.  BRCA-related cancer screening. This may be done if you have a family history of breast, ovarian, tubal, or peritoneal cancers.  Pelvic exam and Pap test. This may be done every 3 years starting at age 80. Starting at age 36, this may be done every 5 years if you have a Pap test in combination with an HPV test.  Bone density scan. This is done to screen for osteoporosis. You may have this scan if you are at high risk for osteoporosis.  Discuss your test results, treatment options, and if necessary, the need for more tests with your health care provider. Vaccines Your health care provider may recommend certain vaccines, such as:  Influenza vaccine. This is recommended every year.  Tetanus, diphtheria, and acellular pertussis (Tdap, Td) vaccine. You may need a Td booster every 10 years.  Varicella vaccine. You may need this if you have not been vaccinated.  Zoster vaccine. You may need this after age 5.  Measles, mumps, and rubella (MMR) vaccine. You may need at least one dose of MMR if you were born in  1958 or later. You may also need a second dose.  Pneumococcal 13-valent conjugate (PCV13) vaccine. You may need this if you have certain conditions and were not previously vaccinated.  Pneumococcal polysaccharide (PPSV23) vaccine. You may need one or two doses if you smoke cigarettes or if you have certain conditions.  Meningococcal vaccine. You may need this if you have certain  conditions.  Hepatitis A vaccine. You may need this if you have certain conditions or if you travel or work in places where you may be exposed to hepatitis A.  Hepatitis B vaccine. You may need this if you have certain conditions or if you travel or work in places where you may be exposed to hepatitis B.  Haemophilus influenzae type b (Hib) vaccine. You may need this if you have certain conditions.  Talk to your health care provider about which screenings and vaccines you need and how often you need them. This information is not intended to replace advice given to you by your health care provider. Make sure you discuss any questions you have with your health care provider. Document Released: 04/14/2015 Document Revised: 12/06/2015 Document Reviewed: 01/17/2015 Elsevier Interactive Patient Education  2018 Elsevier Inc.  

## 2017-06-09 NOTE — Assessment & Plan Note (Signed)
>>  ASSESSMENT AND PLAN FOR LGSIL ON PAP SMEAR OF CERVIX WRITTEN ON 06/09/2017  5:00 PM BY MYRLA JON HERO, MD  Needed Gyn referral after last pap smear for col[po, but ptient states this was never done Will repeat pap today and plan for GYN referral

## 2017-06-09 NOTE — Assessment & Plan Note (Signed)
Needed Gyn referral after last pap smear for col[po, but ptient states this was never done Will repeat pap today and plan for GYN referral

## 2017-06-09 NOTE — Progress Notes (Signed)
Patient: Kayla Washington, Female    DOB: 17-May-1960, 57 y.o.   MRN: [id number] Visit Date: 06/09/2017  Today's Provider: Shirlee LatchAngela Rielly Corlett, MD   I, Joslyn HyEmily Ratchford, CMA, am acting as scribe for Shirlee LatchAngela Ardelia Wrede, MD.  Chief Complaint  Patient presents with  . Annual Exam   Subjective:    Annual physical exam Kayla ChockKimberly A Parkison is a 57 y.o. female who presents today for health maintenance and complete physical. She feels poorly. She is c/o dizziness. She states her BP was checked at the eye doctor last week, and her BP was 145-102. BP is normal today. She states she is also experiencing cold sx, which she is taking OTC Cold medicine for, with some relief, although it does cause drowsiness.    She reports exercising none. She reports she is sleeping fairly well.  Last pap- 04/06/2014- HGSIL- HPV positive. States she was unaware that this was abnormal. Last colonoscopy- 04/17/2012- diverticulosis, internal hemorrhoids. Last mammogram - many years ago, not in system -----------------------------------------------------------------   Review of Systems  Constitutional: Positive for diaphoresis and fatigue. Negative for activity change, appetite change, chills, fever and unexpected weight change.  HENT: Positive for sinus pressure, sore throat and trouble swallowing. Negative for congestion, dental problem, drooling, ear discharge, ear pain, facial swelling, hearing loss, mouth sores, nosebleeds, postnasal drip, rhinorrhea, sinus pain, sneezing, tinnitus and voice change.   Eyes: Positive for visual disturbance. Negative for photophobia, pain, discharge, redness and itching.  Respiratory: Positive for cough, chest tightness and shortness of breath. Negative for apnea, choking, wheezing and stridor.   Cardiovascular: Negative.   Gastrointestinal: Positive for abdominal distention and nausea. Negative for abdominal pain, anal bleeding, blood in stool, constipation, diarrhea, rectal  pain and vomiting.  Endocrine: Positive for polydipsia and polyuria. Negative for cold intolerance, heat intolerance and polyphagia.  Genitourinary: Negative.   Musculoskeletal: Positive for arthralgias, back pain, neck pain and neck stiffness. Negative for gait problem, joint swelling and myalgias.  Skin: Negative.   Allergic/Immunologic: Negative.   Neurological: Positive for dizziness, weakness, light-headedness and numbness. Negative for tremors, seizures, syncope, facial asymmetry, speech difficulty and headaches.  Hematological: Negative for adenopathy. Bruises/bleeds easily.  Psychiatric/Behavioral: Positive for confusion. Negative for agitation, behavioral problems, decreased concentration, dysphoric mood, hallucinations, self-injury, sleep disturbance and suicidal ideas. The patient is nervous/anxious. The patient is not hyperactive.     Social History      She  reports that  has never smoked. she has never used smokeless tobacco. She reports that she drinks about 7.2 - 10.8 oz of alcohol per week. She reports that she does not use drugs.       Social History   Socioeconomic History  . Marital status: Divorced    Spouse name: None  . Number of children: 2  . Years of education: H/S  . Highest education level: High school graduate  Social Needs  . Financial resource strain: Not hard at all  . Food insecurity - worry: Never true  . Food insecurity - inability: Never true  . Transportation needs - medical: No  . Transportation needs - non-medical: No  Occupational History  . Occupation: Tree surgeonAssembler at [employer]    Comment: Full-Time  Tobacco Use  . Smoking status: Never Smoker  . Smokeless tobacco: Never Used  Substance and Sexual Activity  . Alcohol use: Yes    Alcohol/week: 7.2 - 10.8 oz    Types: 12 - 18 Cans of beer per week  Comment: drinks about 6 beers on Friday's, Saturday's and every other Thursday.  . Drug use: No  . Sexual activity: Not Currently    Other Topics Concern  . None  Social History Narrative  . None    History reviewed. No pertinent past medical history.   Patient Active Problem List   Diagnosis Date Noted  . Arthritis 11/04/2014  . Edema 11/04/2014  . Shortness of breath 11/04/2014  . Anxiety 10/11/2014  . 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 Alcohol drinker 04/18/2006  . Menopausal and perimenopausal disorder 04/18/2006    Past Surgical History:  Procedure Laterality Date  . ABDOMINAL HYSTERECTOMY  05/2011   Still has left ovary;  Dr. Arvil Chaco  . COLONOSCOPY  05/2014  . TUBAL LIGATION      Family History        Family Status  Relation Name Status  . Mother  Deceased  . Father  Deceased  . Brother  Alive  . Other  (Not Specified)        Her family history includes Colon polyps in her mother; Diabetes in her mother and other; Healthy in her brother; Heart attack in her father; Heart disease in her other; Hyperlipidemia in her other; Hypertension in her other; Throat cancer in her father.      No Known Allergies   Current Outpatient Medications:  .  aspirin 81 MG tablet, Take 81 mg by mouth daily., Disp: , Rfl:  .  omeprazole (PRILOSEC) 20 MG capsule, Take 20 mg by mouth daily., Disp: , Rfl:    Patient Care Team: Margaretann Loveless, PA-C as PCP - General (Family Medicine)      Objective:   Vitals: BP 120/80 (BP Location: Left Arm, Patient Position: Sitting, Cuff Size: Large)   Pulse 64   Temp 98 F (36.7 C) (Oral)   Resp 16   Ht 5\' 2"  (1.575 m)   Wt 176 lb (79.8 kg)   SpO2 98%   BMI 32.19 kg/m    Vitals:   06/09/17 1544  BP: 120/80  Pulse: 64  Resp: 16  Temp: 98 F (36.7 C)  TempSrc: Oral  SpO2: 98%  Weight: 176 lb (79.8 kg)  Height: 5\' 2"  (1.575 m)     Physical Exam  Constitutional: She is oriented to person, place, and time. She appears well-developed and well-nourished. No distress.  HENT:  Head: Normocephalic and  atraumatic.  Right Ear: External ear normal.  Left Ear: External ear normal.  Nose: Nose normal.  Mouth/Throat: Oropharynx is clear and moist. No oropharyngeal exudate.  Eyes: Conjunctivae and EOM are normal. Pupils are equal, round, and reactive to light. No scleral icterus.  Neck: Neck supple. No thyromegaly present.  Cardiovascular: Normal rate, regular rhythm, normal heart sounds and intact distal pulses.  No murmur heard. Pulmonary/Chest: Effort normal and breath sounds normal. No respiratory distress. She has no wheezes. She has no rales.  Abdominal: Soft. She exhibits no distension. There is no tenderness. There is no rebound.  Genitourinary:  Genitourinary Comments: Breasts: breasts appear normal, no suspicious masses, no skin or nipple changes or axillary nodes. GYN:  External genitalia within normal limits.  Vaginal mucosa pink, moist, normal rugae.  Nonfriable cervix (very posterior) without lesions, no discharge or bleeding noted on speculum exam.  Bimanual exam revealed normal, nongravid uterus.  No cervical motion tenderness. No adnexal masses bilaterally.    Musculoskeletal: She exhibits no edema.  Lymphadenopathy:  She has no cervical adenopathy.  Neurological: She is alert and oriented to person, place, and time.  Skin: Skin is warm and dry. No rash noted.  Psychiatric: She has a normal mood and affect. Her behavior is normal.  Vitals reviewed.    Depression Screen PHQ 2/9 Scores 06/09/2017  PHQ - 2 Score 4  PHQ- 9 Score 14      Assessment & Plan:     Routine Health Maintenance and Physical Exam  Exercise Activities and Dietary recommendations Goals    None      Immunization History  Administered Date(s) Administered  . Td 04/01/1997    Health Maintenance  Topic Date Due  . HIV Screening  01/29/1975  . PAP SMEAR  01/28/1981  . TETANUS/TDAP  04/02/2007  . MAMMOGRAM  01/28/2010  . INFLUENZA VACCINE  10/30/2016  . COLONOSCOPY  06/14/2024  .  Hepatitis C Screening  Completed     Discussed health benefits of physical activity, and encouraged her to engage in regular exercise appropriate for her age and condition.    --------------------------------------------------------------------  Problem List Items Addressed This Visit      Other   HPV (human papilloma virus) infection   High grade squamous intraepithelial lesion (HGSIL) on cytologic smear of cervix    Needed Gyn referral after last pap smear for col[po, but ptient states this was never done Will repeat pap today and plan for GYN referral      Relevant Orders   Pap IG and HPV (high risk) DNA detection    Other Visit Diagnoses    Encounter for annual physical exam    -  Primary   Relevant Orders   Comprehensive metabolic panel   Lipid panel   Screening for HIV (human immunodeficiency virus)       Relevant Orders   HIV antibody (with reflex)   Encounter for screening mammogram for breast cancer       Relevant Orders   MM SCREENING BREAST TOMO BILATERAL   Need for Tdap vaccination       Relevant Orders   Tdap vaccine greater than or equal to 7yo IM (Completed)       Return in about 1 year (around 06/10/2018) for physical.   The entirety of the information documented in the History of Present Illness, Review of Systems and Physical Exam were personally obtained by me. Portions of this information were initially documented by Irving Burton Ratchford, CMA and reviewed by me for thoroughness and accuracy.    Erasmo Downer, MD, MPH [facility] 06/09/2017 5:00 PM

## 2017-06-12 ENCOUNTER — Telehealth: Payer: Self-pay

## 2017-06-12 ENCOUNTER — Other Ambulatory Visit: Payer: Self-pay | Admitting: Family Medicine

## 2017-06-12 DIAGNOSIS — R87612 Low grade squamous intraepithelial lesion on cytologic smear of cervix (LGSIL): Secondary | ICD-10-CM

## 2017-06-12 LAB — PAP IG AND HPV HIGH-RISK
HPV, high-risk: POSITIVE — AB
PAP Smear Comment: 0

## 2017-06-12 NOTE — Telephone Encounter (Signed)
[name] called pt about referral. She will return pt's call.

## 2017-06-12 NOTE — Telephone Encounter (Signed)
Patient called back asking to speak with Irving BurtonEmily. She says she is returning her call again. I didn't see any open messages to give to the patient.  Patient states Irving Burtonmily called back again and left a message for her to call back.

## 2017-06-12 NOTE — Telephone Encounter (Signed)
Pt advised and agrees with plan. 

## 2017-06-12 NOTE — Telephone Encounter (Signed)
[email or web address]

## 2017-06-12 NOTE — Telephone Encounter (Signed)
-----   Message from Erasmo DownerAngela M Bacigalupo, MD sent at 06/12/2017 12:17 PM EDT ----- Pap smear shows low grade lesions.  This is slightly better than last pap smear, but still abnormal.  HPV positive.  Needs to be seen by GYN (referral placed) for colposcopy (looking more closely) and possible biopsy.  Erasmo DownerBacigalupo, Angela M, MD, MPH [facility] 06/12/2017 12:16 PM

## 2017-06-14 ENCOUNTER — Emergency Department: Payer: BLUE CROSS/BLUE SHIELD

## 2017-06-14 ENCOUNTER — Encounter: Payer: Self-pay | Admitting: Emergency Medicine

## 2017-06-14 ENCOUNTER — Other Ambulatory Visit: Payer: Self-pay

## 2017-06-14 ENCOUNTER — Emergency Department
Admission: EM | Admit: 2017-06-14 | Discharge: 2017-06-14 | Disposition: A | Discharge status: Home / Self Care | Payer: BLUE CROSS/BLUE SHIELD | Attending: Emergency Medicine | Admitting: Emergency Medicine

## 2017-06-14 DIAGNOSIS — R51 Headache: Secondary | ICD-10-CM | POA: Insufficient documentation

## 2017-06-14 DIAGNOSIS — R531 Weakness: Secondary | ICD-10-CM | POA: Insufficient documentation

## 2017-06-14 DIAGNOSIS — R112 Nausea with vomiting, unspecified: Secondary | ICD-10-CM | POA: Diagnosis not present

## 2017-06-14 DIAGNOSIS — R519 Headache, unspecified: Secondary | ICD-10-CM

## 2017-06-14 DIAGNOSIS — Z79899 Other long term (current) drug therapy: Secondary | ICD-10-CM | POA: Insufficient documentation

## 2017-06-14 DIAGNOSIS — R110 Nausea: Secondary | ICD-10-CM

## 2017-06-14 DIAGNOSIS — Z7982 Long term (current) use of aspirin: Secondary | ICD-10-CM | POA: Insufficient documentation

## 2017-06-14 HISTORY — DX: Gastro-esophageal reflux disease without esophagitis: K21.9

## 2017-06-14 LAB — CBC
HCT: 41.2 % (ref 35.0–47.0)
Hemoglobin: 13.6 g/dL (ref 12.0–16.0)
MCH: 27.6 pg (ref 26.0–34.0)
MCHC: 33.1 g/dL (ref 32.0–36.0)
MCV: 83.6 fL (ref 80.0–100.0)
PLATELETS: 345 10*3/uL (ref 150–440)
RBC: 4.93 MIL/uL (ref 3.80–5.20)
RDW: 14.5 % (ref 11.5–14.5)
WBC: 6.3 10*3/uL (ref 3.6–11.0)

## 2017-06-14 LAB — URINALYSIS, COMPLETE (UACMP) WITH MICROSCOPIC
Bacteria, UA: NONE SEEN
Bilirubin Urine: NEGATIVE
GLUCOSE, UA: NEGATIVE mg/dL
Hgb urine dipstick: NEGATIVE
KETONES UR: NEGATIVE mg/dL
Leukocytes, UA: NEGATIVE
Nitrite: NEGATIVE
Protein, ur: NEGATIVE mg/dL
SPECIFIC GRAVITY, URINE: 1.006 (ref 1.005–1.030)
pH: 6 (ref 5.0–8.0)

## 2017-06-14 LAB — COMPREHENSIVE METABOLIC PANEL
ALK PHOS: 97 U/L (ref 38–126)
ALT: 29 U/L (ref 14–54)
AST: 33 U/L (ref 15–41)
Albumin: 4.2 g/dL (ref 3.5–5.0)
Anion gap: 9 (ref 5–15)
BUN: 11 mg/dL (ref 6–20)
CHLORIDE: 105 mmol/L (ref 101–111)
CO2: 23 mmol/L (ref 22–32)
CREATININE: 0.75 mg/dL (ref 0.44–1.00)
Calcium: 9.7 mg/dL (ref 8.9–10.3)
Glucose, Bld: 118 mg/dL — ABNORMAL HIGH (ref 65–99)
Potassium: 4.2 mmol/L (ref 3.5–5.1)
SODIUM: 137 mmol/L (ref 135–145)
Total Bilirubin: 0.6 mg/dL (ref 0.3–1.2)
Total Protein: 7.6 g/dL (ref 6.5–8.1)

## 2017-06-14 LAB — LIPASE, BLOOD: Lipase: 35 U/L (ref 11–51)

## 2017-06-14 LAB — TROPONIN I: Troponin I: <0.03 ng/mL (ref <0.03)

## 2017-06-14 LAB — TSH: TSH: 1.843 u[IU]/mL (ref 0.350–4.500)

## 2017-06-14 LAB — GLUCOSE, CAPILLARY: Glucose-Capillary: 122 mg/dL — ABNORMAL HIGH (ref 65–99)

## 2017-06-14 MED ORDER — SODIUM CHLORIDE 0.9 % IV BOLUS (SEPSIS)
1000.0000 mL | Freq: Once | INTRAVENOUS | Status: AC
  Administered 2017-06-14: 1000 mL via INTRAVENOUS

## 2017-06-14 MED ORDER — ONDANSETRON HCL 4 MG PO TABS: 4.0000 mg | ORAL_TABLET | Freq: Every day | ORAL | 0 refills | Status: DC | PRN

## 2017-06-14 MED ORDER — KETOROLAC TROMETHAMINE 30 MG/ML IJ SOLN
30.0000 mg | Freq: Once | INTRAMUSCULAR | Status: AC
Start: 2017-06-14 — End: 2017-06-14
  Administered 2017-06-14: 30 mg via INTRAVENOUS
  Filled 2017-06-14: qty 1

## 2017-06-14 MED ORDER — DIPHENHYDRAMINE HCL 50 MG/ML IJ SOLN
25.0000 mg | Freq: Once | INTRAMUSCULAR | Status: AC
  Administered 2017-06-14: 25 mg via INTRAVENOUS
  Filled 2017-06-14: qty 1

## 2017-06-14 MED ORDER — PROCHLORPERAZINE EDISYLATE 5 MG/ML IJ SOLN
10.0000 mg | Freq: Once | INTRAMUSCULAR | Status: AC
  Administered 2017-06-14: 10 mg via INTRAVENOUS
  Filled 2017-06-14: qty 2

## 2017-06-14 NOTE — ED Triage Notes (Signed)
Pt c/o headache, nausea, loss of appetite x 2 weeks. Has dr appt this past Monday for physical and to back next week for blood work

## 2017-06-14 NOTE — ED Provider Notes (Signed)
[facility] Emergency Department Provider Note  ____________________________________________   None    (approximate)  I have reviewed the triage vital signs and the nursing notes.   HISTORY  Chief Complaint Nausea and Headache  HPI Kayla Washington is a 57 y.o. female with a history of HPV as well as what she describes as social drinking who is presenting to the emergency department today with 2 weeks of weakness, near-syncope now 5 out of 10 headache which is frontal and pressure-like since last night.  She says that she has been nauseous with a decreased appetite.  Says the headache has been gradual in onset.  Denies abdominal pain chest pain.  Says that she has had some photophobia.  No history of migraine headache.  Says that she has not had a drink in 2 weeks.   Past Medical History:  Diagnosis Date  . GERD (gastroesophageal reflux disease)     Patient Active Problem List   Diagnosis Date Noted  . 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 Arthritis 11/04/2014  . Anxiety 10/11/2014  . 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 Alcohol drinker 04/18/2006  . Menopausal and perimenopausal disorder 04/18/2006    Past Surgical History:  Procedure Laterality Date  . ABDOMINAL HYSTERECTOMY  05/2011   Still has left ovary;  Dr. Arvil ChacoVan Dalen  . COLONOSCOPY  05/2014  . TUBAL LIGATION      Prior to Admission medications   Medication Sig Start Date End Date Taking? Authorizing Provider  aspirin 81 MG tablet Take 81 mg by mouth daily.    [provider]  omeprazole (PRILOSEC) 20 MG capsule Take 20 mg by mouth daily.    [provider]    Allergies Patient has no known allergies.  Family History  Problem Relation Age of Onset  . Diabetes Mother   . Colon polyps Mother   . Heart attack Father   . Throat cancer Father   . Healthy Brother   . Hypertension Other   . Hyperlipidemia Other    . Heart disease Other   . Diabetes Other     Social History Social History   Tobacco Use  . Smoking status: Never Smoker  . Smokeless tobacco: Never Used  Substance Use Topics  . Alcohol use: Yes    Alcohol/week: 7.2 - 10.8 oz    Types: 12 - 18 Cans of beer per week    Comment: drinks about 6 beers on Friday's, Saturday's and every other Thursday.  . Drug use: No    Review of Systems  Constitutional: No fever/chills Eyes: No visual changes. ENT: No sore throat. Cardiovascular: Denies chest pain. Respiratory: Denies shortness of breath. Gastrointestinal: No abdominal pain.  no vomiting.  No diarrhea.  No constipation. Genitourinary: Negative for dysuria. Musculoskeletal: Negative for back pain. Skin: Negative for rash. Neurological: Negative for focal weakness or numbness.   ____________________________________________   PHYSICAL EXAM:  VITAL SIGNS: ED Triage Vitals  Enc Vitals Group     BP 06/14/17 1201 (!) 138/94     Pulse Rate 06/14/17 1201 79     Resp 06/14/17 1201 18     Temp 06/14/17 1201 98 F (36.7 C)     Temp Source 06/14/17 1201 Oral     SpO2 06/14/17 1201 97 %     Weight 06/14/17 1202 174 lb (78.9 kg)     Height 06/14/17 1202 5\' 7"  (1.702 m)  Head Circumference --      Peak Flow --      Pain Score 06/14/17 1234 5     Pain Loc --      Pain Edu? --      Excl. in GC? --     Constitutional: Alert and oriented. Well appearing and in no acute distress. Eyes: Conjunctivae are normal.  Head: Atraumatic. Nose: No congestion/rhinnorhea. Mouth/Throat: Mucous membranes are moist.  Neck: No stridor.   Cardiovascular: Normal rate, regular rhythm. Grossly normal heart sounds.   Respiratory: Normal respiratory effort.  No retractions. Lungs CTAB. Gastrointestinal: Soft and nontender. No distention.  Musculoskeletal: No lower extremity tenderness nor edema.  No joint effusions. Neurologic:  Normal speech and language. No gross focal neurologic  deficits are appreciated. Skin:  Skin is warm, dry and intact. No rash noted. Psychiatric: Mood and affect are normal. Speech and behavior are normal.  ____________________________________________   LABS (all labs ordered are listed, but only abnormal results are displayed)  Labs Reviewed  GLUCOSE, CAPILLARY - Abnormal; Notable for the following components:      Result Value   Glucose-Capillary 122 (*)    All other components within normal limits  COMPREHENSIVE METABOLIC PANEL - Abnormal; Notable for the following components:   Glucose, Bld 118 (*)    All other components within normal limits  LIPASE, BLOOD  CBC  TROPONIN I  TSH  URINALYSIS, COMPLETE (UACMP) WITH MICROSCOPIC   ____________________________________________  EKG  ED ECG REPORT I, Arelia Longest, the attending physician, personally viewed and interpreted this ECG.   Date: 06/14/2017  EKG Time: 1210  Rate: 69  Rhythm: normal sinus rhythm  Axis: Normal  Intervals:Incomplete right bundle branch block.  ST&T Change: No ST segment elevation or depression.  No abnormal T wave inversion.  ____________________________________________  RADIOLOGY  CT head without acute process. ____________________________________________   PROCEDURES  Procedure(s) performed:   Procedures  Critical Care performed:   ____________________________________________   INITIAL IMPRESSION / ASSESSMENT AND PLAN / ED COURSE  Pertinent labs & imaging results that were available during my care of the patient were reviewed by me and considered in my medical decision making (see chart for details).  Differential diagnosis includes, but is not limited to, intracranial hemorrhage, meningitis/encephalitis, previous head trauma, cavernous venous thrombosis, tension headache, temporal arteritis, migraine or migraine equivalent, idiopathic intracranial hypertension, and non-specific headache. As part of my medical decision making, I  reviewed the following data within the electronic MEDICAL RECORD NUMBER Notes from prior office visits  ----------------------------------------- 4:53 PM on 06/14/2017 -----------------------------------------  Patient at this time feels greatly improved.  Says headache is minimal.  No nausea.  Patient will be discharged with Zofran.  No acute process found on workup today.  Patient will follow up with her primary care doctor for further workup and treatment.  We discussed imaging as well as lab results.  Patient does understand the diagnosis as well as treatment plan willing to comply. ____________________________________________   FINAL CLINICAL IMPRESSION(S) / ED DIAGNOSES  Headache.  Nausea.  Weakness.    NEW MEDICATIONS STARTED DURING THIS VISIT:  New Prescriptions   No medications on file     Note:  This document was prepared using Dragon voice recognition software and may include unintentional dictation errors.     Myrna Blazer, MD 06/14/17 [phone]

## 2017-06-18 ENCOUNTER — Telehealth: Payer: Self-pay

## 2017-06-18 NOTE — Telephone Encounter (Signed)
Patient is asking what is the update on her referral to the gynecologist. I gave patient the number to call [facility]. Do you anything?  Patient contact number is [phone]  Thanks,  -Joseline

## 2017-07-11 ENCOUNTER — Ambulatory Visit: Payer: Self-pay

## 2017-07-11 ENCOUNTER — Ambulatory Visit (INDEPENDENT_AMBULATORY_CARE_PROVIDER_SITE_OTHER): Payer: BLUE CROSS/BLUE SHIELD | Admitting: Obstetrics and Gynecology

## 2017-07-11 ENCOUNTER — Ambulatory Visit: Payer: Self-pay | Admitting: Obstetrics and Gynecology

## 2017-07-11 ENCOUNTER — Encounter: Payer: Self-pay | Admitting: Obstetrics and Gynecology

## 2017-07-11 VITALS — BP 140/84 | HR 69 | Ht 66.0 in | Wt 172.0 lb

## 2017-07-11 DIAGNOSIS — R87612 Low grade squamous intraepithelial lesion on cytologic smear of cervix (LGSIL): Secondary | ICD-10-CM | POA: Diagnosis not present

## 2017-07-11 DIAGNOSIS — Z90710 Acquired absence of both cervix and uterus: Secondary | ICD-10-CM

## 2017-07-11 NOTE — Progress Notes (Signed)
   GYNECOLOGY CLINIC COLPOSCOPY PROCEDURE NOTE  57 y.o. [id number] here for colposcopy for low-grade squamous intraepithelial neoplasia (LGSIL - encompassing HPV,mild dysplasia,CIN I) HPV + pap smear on 06/09/17. Discussed underlying role for HPV infection in the development of cervical dysplasia, its natural history and progression/regression, need for surveillance.  Is the patient  pregnant: No LMP: No LMP recorded. Patient has had a hysterectomy. Smoking status:  reports that she has never smoked. She has never used smokeless tobacco. Contraception: none Number of partners in lifetime:  "around 5110" Future fertility desired:  No  Patient given informed consent, signed copy in the chart, time out was performed.  The patient was position in dorsal lithotomy position. Speculum was placed the cervix was visualized.   After application of acetic acid colposcopic inspection of the cervix was undertaken.   Cervix surgically absent. Ascetic Acid applied to vaginal cuff. Small changes to vaginal mucosa. One biopsy of cuff performed.  All specimens were labeled and sent to pathology.   Patient was given post procedure instructions.  Will follow up pathology and manage accordingly.  Routine preventative health maintenance measures emphasized.  Physical Exam  Genitourinary:  Genitourinary Comments: Normal vaginal cuff. Could not palpate cervix  on bimanual exam. One biopsy of cuff taken.     Adelene Idlerhristanna Tareek Sabo MD [facility], [facility] 07/11/17 11:02 AM

## 2017-07-17 LAB — PATHOLOGY

## 2017-07-23 ENCOUNTER — Encounter: Payer: Self-pay | Admitting: Obstetrics and Gynecology

## 2017-07-23 ENCOUNTER — Ambulatory Visit (INDEPENDENT_AMBULATORY_CARE_PROVIDER_SITE_OTHER): Payer: BLUE CROSS/BLUE SHIELD | Admitting: Obstetrics and Gynecology

## 2017-07-23 DIAGNOSIS — N890 Mild vaginal dysplasia: Secondary | ICD-10-CM | POA: Diagnosis not present

## 2017-07-23 NOTE — Progress Notes (Signed)
Patient ID: Kayla Washington, female   DOB: Jun 16, 1960, 57 y.o.   MRN: [id number]  Reason for Consult: Follow-up   Referred by Erasmo Downer, MD  Subjective:     HPI:  Kayla Washington is a 57 y.o. female . She if following up today after a colposcopy for an abnormal pap smear after a total hysterectomy.  Past Medical History:  Diagnosis Date  . GERD (gastroesophageal reflux disease)    Family History  Problem Relation Age of Onset  . Diabetes Mother   . Colon polyps Mother   . Heart attack Father   . Throat cancer Father   . Healthy Brother   . Hypertension Other   . Hyperlipidemia Other   . Heart disease Other   . Diabetes Other    Past Surgical History:  Procedure Laterality Date  . ABDOMINAL HYSTERECTOMY  05/2011   Still has left ovary;  Dr. Arvil Chaco  . COLONOSCOPY  05/2014  . TUBAL LIGATION      Short Social History:  Social History   Tobacco Use  . Smoking status: Never Smoker  . Smokeless tobacco: Never Used  Substance Use Topics  . Alcohol use: Yes    Alcohol/week: 7.2 - 10.8 oz    Types: 12 - 18 Cans of beer per week    Comment: drinks about 6 beers on Friday's, Saturday's and every other Thursday.    No Known Allergies  Current Outpatient Medications  Medication Sig Dispense Refill  . aspirin 81 MG tablet Take 81 mg by mouth daily.    . Calcium Carb-Cholecalciferol (CALCIUM-VITAMIN D3) 600-400 MG-UNIT CAPS Take by mouth.    Marland Kitchen omeprazole (PRILOSEC) 20 MG capsule Take 20 mg by mouth daily.    Marland Kitchen PREVIDENT 5000 DRY MOUTH 1.1 % GEL dental gel   5  . Probiotic Product (PROBIOTIC-10 PO) Take by mouth.    . ondansetron (ZOFRAN) 4 MG tablet Take 1 tablet (4 mg total) by mouth daily as needed. (Patient not taking: Reported on 07/23/2017) 10 tablet 0   No current facility-administered medications for this visit.     Review of Systems  Constitutional: Negative for chills, fatigue, fever and unexpected weight change.  HENT: Negative for  trouble swallowing.       Sore throat Eyes: Negative for loss of vision.  Respiratory: Negative for cough, shortness of breath and wheezing.  Cardiovascular: Negative for chest pain, leg swelling, palpitations and syncope.  GI: Negative for abdominal pain, blood in stool, diarrhea, nausea and vomiting.  GU: Negative for difficulty urinating, dysuria, frequency and hematuria.  Musculoskeletal: Negative for back pain, leg pain and joint pain.  Skin: Negative for rash.  Neurological: Negative for dizziness, headaches, light-headedness, numbness and seizures.  Psychiatric: Negative for behavioral problem, confusion, depressed mood and sleep disturbance.        Objective:  Objective   Vitals:   07/23/17 1608  BP: 114/72  Pulse: 73  Weight: 174 lb (78.9 kg)  Height: 5\' 6"  (1.676 m)   Body mass index is 28.08 kg/m.  Physical Exam  Constitutional: She is oriented to person, place, and time. She appears well-developed and well-nourished.  HENT:  Head: Normocephalic and atraumatic.  Eyes: Pupils are equal, round, and reactive to light. EOM are normal.  Cardiovascular: Normal rate and regular rhythm.  Pulmonary/Chest: Effort normal. No respiratory distress. She has no wheezes.  Abdominal: Soft.  Neurological: She is alert and oriented to person, place, and time.  Skin: Skin is  warm and dry.  Psychiatric: She has a normal mood and affect. Her behavior is normal. Judgment and thought content normal.  Nursing note and vitals reviewed.        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  Printed information about VAIN for patient from [employer]od[employer]. Discussed surveillance of VAIN I with yearly colposcopy. Will have patient return in one year. Healthy lifestyle advised.   25 minutes spent in room with the patient with more than 50% spent providing counseling about her medical condition.   Adelene Idlerhristanna Carnisha Feltz MD [facility], [facility] 07/23/17 4:28 PM

## 2018-06-11 ENCOUNTER — Encounter: Payer: Self-pay | Admitting: Family Medicine

## 2020-12-28 ENCOUNTER — Encounter: Payer: Self-pay | Admitting: Obstetrics and Gynecology

## 2020-12-28 ENCOUNTER — Other Ambulatory Visit: Payer: Self-pay

## 2020-12-28 ENCOUNTER — Ambulatory Visit (INDEPENDENT_AMBULATORY_CARE_PROVIDER_SITE_OTHER): Payer: BC Managed Care – PPO | Admitting: Obstetrics and Gynecology

## 2020-12-28 VITALS — BP 124/70 | Ht 66.0 in | Wt 182.0 lb

## 2020-12-28 DIAGNOSIS — R071 Chest pain on breathing: Secondary | ICD-10-CM

## 2020-12-28 DIAGNOSIS — N644 Mastodynia: Secondary | ICD-10-CM | POA: Diagnosis not present

## 2020-12-28 MED ORDER — IBUPROFEN 600 MG PO TABS: 600.0000 mg | ORAL_TABLET | Freq: Four times a day (QID) | ORAL | 0 refills | Status: DC | PRN

## 2020-12-28 NOTE — Patient Instructions (Signed)
Breast Tenderness Breast tenderness is a common problem for women of all ages, but may also occur in men. Breast tenderness may range from mild discomfort to severe pain. In women, the pain usually comes and goes with the menstrual cycle, but it can also be constant. Breast tenderness has many possible causes, including hormone changes, infections, and taking certain medicines. You may have tests, such as a mammogram or an ultrasound, to check for any unusual findings. Having breast tenderness usually does not mean that you have breast cancer. Follow these instructions at home: Managing pain and discomfort  If directed, put ice to the painful area. To do this: Put ice in a plastic bag. Place a towel between your skin and the bag. Leave the ice on for 20 minutes, 2-3 times a day. Wear a supportive bra, especially during exercise. You may also want to wear a supportive bra while sleeping if your breasts are very tender. Medicines Take over-the-counter and prescription medicines only as told by your health care provider. If the cause of your pain is infection, you may be prescribed an antibiotic medicine. If you were prescribed an antibiotic, take it as told by your health care provider. Do not stop taking the antibiotic even if you start to feel better. Eating and drinking Your health care provider may recommend that you lessen the amount of fat in your diet. You can do this by: Limiting fried foods. Cooking foods using methods such as baking, boiling, grilling, and broiling. Decrease the amount of caffeine in your diet. Instead, drink more water and choose caffeine-free drinks. General instructions  Keep a log of the days and times when your breasts are most tender. Ask your health care provider how to do breast exams at home. This will help you notice if you have an unusual growth or lump. Keep all follow-up visits as told by your health care provider. This is important. Contact a health care  provider if: Any part of your breast is hard, red, and hot to the touch. This may be a sign of infection. You are a woman and: Not breastfeeding and you have fluid, especially blood or pus, coming out of your nipples. Have a new or painful lump in your breast that remains after your menstrual period ends. You have a fever. Your pain does not improve or it gets worse. Your pain is interfering with your daily activities. Summary Breast tenderness may range from mild discomfort to severe pain. Breast tenderness has many possible causes, including hormone changes, infections, and taking certain medicines. It can be treated with ice, wearing a supportive bra, and medicines. Make changes to your diet if told to by your health care provider. This information is not intended to replace advice given to you by your health care provider. Make sure you discuss any questions you have with your health care provider. Document Revised: 08/10/2018 Document Reviewed: 08/10/2018 Elsevier Patient Education  2022 [employer]vier Inc.  

## 2020-12-28 NOTE — Progress Notes (Signed)
Patient ID: Kayla Washington, female   DOB: [birth year], 60 y.o.   MRN: [id number]  Reason for Consult: Gynecologic Exam   Referred by Erasmo Downer, MD  Subjective:     HPI:  Kayla Washington is a 60 y.o. female she reports that she has had left-sided breast pain this week.  She feels like the pain is in the middle of her breast tissue.  She reports that the pain started after a coughing fit on Monday.  She is having extreme tenderness of the left breast.  It feels somewhat better when it is supported with her hand.  She reports that when she sleeps at night she has significant pain when turning side to side in the left breast.  She is having to sleep flat on her back so that the breast does not move much.  She denies any skin changes or nipple discharge.  She has not noticed any lumps in her breast.  She has not had a mammogram in over 4 years.  She reports some pain associated with taking a deep breath.  She reports associated shortness of breath with the pain.  She denies pain radiating to her back or down her arm.  She denies any fevers.  She has tried Tylenol Motrin over-the-counter cough medicine and acid reflux medications to help with the pain.  She used a heating pad and reported that this helped the most.  Gynecological History  No LMP recorded. Patient has had a hysterectomy. Past Medical History:  Diagnosis Date   GERD (gastroesophageal reflux disease)    Family History  Problem Relation Age of Onset   Diabetes Mother    Colon polyps Mother    Heart attack Father    Throat cancer Father    Healthy Brother    Hypertension Other    Hyperlipidemia Other    Heart disease Other    Diabetes Other    Past Surgical History:  Procedure Laterality Date   ABDOMINAL HYSTERECTOMY  05/2011   Still has left ovary;  Dr. Arvil Chaco   COLONOSCOPY  05/2014   TUBAL LIGATION      Short Social History:  Social History   Tobacco Use   Smoking status: Never   Smokeless  tobacco: Never  Substance Use Topics   Alcohol use: Yes    Alcohol/week: 12.0 - 18.0 standard drinks    Types: 12 - 18 Cans of beer per week    Comment: drinks about 6 beers on Friday's, Saturday's and every other Thursday.    No Known Allergies  Current Outpatient Medications  Medication Sig Dispense Refill   aspirin 81 MG tablet Take 81 mg by mouth daily.     Calcium Carb-Cholecalciferol (CALCIUM-VITAMIN D3) 600-400 MG-UNIT CAPS Take by mouth.     ibuprofen (ADVIL) 600 MG tablet Take 1 tablet (600 mg total) by mouth every 6 (six) hours as needed. 60 tablet 0   omeprazole (PRILOSEC) 20 MG capsule Take 20 mg by mouth daily.     PREVIDENT 5000 DRY MOUTH 1.1 % GEL dental gel   5   Probiotic Product (PROBIOTIC-10 PO) Take by mouth.     FLUoxetine (PROZAC) 20 MG capsule Take 20 mg by mouth daily.     ondansetron (ZOFRAN) 4 MG tablet Take 1 tablet (4 mg total) by mouth daily as needed. (Patient not taking: Reported on 07/23/2017) 10 tablet 0   No current facility-administered medications for this visit.    Review of Systems  Constitutional: Negative for chills, fatigue, fever and unexpected weight change.  HENT: Negative for trouble swallowing.  Eyes: Negative for loss of vision.  Respiratory: Negative for cough, shortness of breath and wheezing.  Cardiovascular: Negative for chest pain, leg swelling, palpitations and syncope.  GI: Negative for abdominal pain, blood in stool, diarrhea, nausea and vomiting.  GU: Negative for difficulty urinating, dysuria, frequency and hematuria.  Musculoskeletal: Negative for back pain, leg pain and joint pain.  Skin: Negative for rash.  Neurological: Negative for dizziness, headaches, light-headedness, numbness and seizures.  Psychiatric: Negative for behavioral problem, confusion, depressed mood and sleep disturbance.       Objective:  Objective   Vitals:   12/28/20 1424  BP: 124/70  Weight: 182 lb (82.6 kg)  Height: 5\' 6"  (1.676 m)   Body  mass index is 29.38 kg/m.  Physical Exam Vitals and nursing note reviewed. Exam conducted with a chaperone present.  Constitutional:      Appearance: Normal appearance.  HENT:     Head: Normocephalic and atraumatic.  Eyes:     Extraocular Movements: Extraocular movements intact.     Pupils: Pupils are equal, round, and reactive to light.  Cardiovascular:     Rate and Rhythm: Normal rate and regular rhythm.  Pulmonary:     Effort: Pulmonary effort is normal.     Breath sounds: Normal breath sounds.  Chest:     Chest wall: Tenderness present. No mass, lacerations, deformity, swelling, crepitus or edema.  Breasts:    Right: Normal. No swelling, bleeding, inverted nipple, mass, nipple discharge, skin change or tenderness.     Left: Tenderness present. No swelling, bleeding, inverted nipple, mass, nipple discharge or skin change.  Abdominal:     General: Abdomen is flat.     Palpations: Abdomen is soft.  Musculoskeletal:     Cervical back: Normal range of motion.  Lymphadenopathy:     Upper Body:     Right upper body: No supraclavicular, axillary or pectoral adenopathy.     Left upper body: No supraclavicular, axillary or pectoral adenopathy.  Skin:    General: Skin is warm and dry.  Neurological:     General: No focal deficit present.     Mental Status: She is alert and oriented to person, place, and time.  Psychiatric:        Behavior: Behavior normal.        Thought Content: Thought content normal.        Judgment: Judgment normal.    Assessment/Plan:    60 year old with left-sided breast pain Discussed supportive care for chest pain.  Recommended NSAID every 6 hours over the next 7 days.  Recommended hot and cold compresses.  Recommended supportive bra.  Discussed that she can wear a sports bra overnight and wrap an Ace wrap over the bra to help support her breast while sleeping.  Recommended patient have her screening mammogram.  Discussed to [facility]CP program for  underinsured patients.  Will obtain EKG.  Instructed patient to proceed to the medical mall to have the EKG performed.  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  I recommended yearly colposcopy at that time.  Encouraged the patient to follow-up for colposcopy and the next 4 weeks.  More than 30 minutes were spent face to face with the patient in the room, reviewing the medical record, labs and images, and coordinating care for the patient. The plan of management was discussed in detail and counseling was  provided.      Adelene Idler MD [facility] 12/28/2020 2:53 PM

## 2021-05-04 ENCOUNTER — Encounter: Payer: Self-pay | Admitting: Family Medicine

## 2021-05-04 ENCOUNTER — Other Ambulatory Visit: Payer: Self-pay

## 2021-05-04 ENCOUNTER — Ambulatory Visit: Payer: BC Managed Care – PPO | Admitting: Family Medicine

## 2021-05-04 VITALS — BP 120/80 | HR 64 | Ht 66.0 in | Wt 176.0 lb

## 2021-05-04 DIAGNOSIS — F419 Anxiety disorder, unspecified: Secondary | ICD-10-CM

## 2021-05-04 DIAGNOSIS — Z1231 Encounter for screening mammogram for malignant neoplasm of breast: Secondary | ICD-10-CM

## 2021-05-04 DIAGNOSIS — N890 Mild vaginal dysplasia: Secondary | ICD-10-CM

## 2021-05-04 DIAGNOSIS — Z7689 Persons encountering health services in other specified circumstances: Secondary | ICD-10-CM | POA: Diagnosis not present

## 2021-05-04 DIAGNOSIS — F32A Depression, unspecified: Secondary | ICD-10-CM

## 2021-05-04 DIAGNOSIS — K219 Gastro-esophageal reflux disease without esophagitis: Secondary | ICD-10-CM | POA: Diagnosis not present

## 2021-05-04 MED ORDER — BUSPIRONE HCL 7.5 MG PO TABS: 7.5000 mg | ORAL_TABLET | Freq: Two times a day (BID) | ORAL | 2 refills | Status: DC

## 2021-05-04 MED ORDER — FLUOXETINE HCL 20 MG PO CAPS: 20.0000 mg | ORAL_CAPSULE | Freq: Every day | ORAL | 1 refills | Status: DC

## 2021-05-04 NOTE — Progress Notes (Signed)
Date:  05/04/2021   Name:  Kayla Washington   DOB:  Oct 09, 1960   MRN:  [id number]   Chief Complaint: Establish Care  Patient is a 61 year old female who presents for a establish care exam. The patient reports the following problems: abn vag pap/needsmammogram/depression and anxiety/. Health maintenance has been reviewed mammogram/pap.     Lab Results  Component Value Date   NA 137 06/14/2017   K 4.2 06/14/2017   CO2 23 06/14/2017   GLUCOSE 118 (H) 06/14/2017   BUN 11 06/14/2017   CREATININE 0.75 06/14/2017   CALCIUM 9.7 06/14/2017   GFRNONAA >60 06/14/2017   Lab Results  Component Value Date   CHOL 221 (H) 05/17/2016   HDL 69 05/17/2016   LDLCALC 134 (H) 05/17/2016   TRIG 88 05/17/2016   CHOLHDL 3.2 05/17/2016   Lab Results  Component Value Date   TSH 1.843 06/14/2017   No results found for: HGBA1C Lab Results  Component Value Date   WBC 6.3 06/14/2017   HGB 13.6 06/14/2017   HCT 41.2 06/14/2017   MCV 83.6 06/14/2017   PLT 345 06/14/2017   Lab Results  Component Value Date   ALT 29 06/14/2017   AST 33 06/14/2017   ALKPHOS 97 06/14/2017   BILITOT 0.6 06/14/2017   No results found for: 25OHVITD2, 25OHVITD3, VD25OH   Review of Systems  Constitutional:  Negative for chills and fever.  HENT:  Negative for drooling, ear discharge, ear pain and sore throat.   Respiratory:  Negative for cough, shortness of breath and wheezing.   Cardiovascular:  Negative for chest pain, palpitations and leg swelling.  Gastrointestinal:  Negative for abdominal pain, blood in stool, constipation, diarrhea and nausea.  Endocrine: Negative for polydipsia.  Genitourinary:  Negative for dysuria, frequency, hematuria and urgency.  Musculoskeletal:  Negative for back pain, myalgias and neck pain.  Skin:  Negative for rash.  Allergic/Immunologic: Negative for environmental allergies.  Neurological:  Negative for dizziness and headaches.  Hematological:  Does not bruise/bleed easily.   Psychiatric/Behavioral:  Negative for suicidal ideas. The patient is not nervous/anxious.    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 Arvil Chaco   COLONOSCOPY  05/2014   TUBAL LIGATION      Social History   Tobacco Use   Smoking status: Never   Smokeless tobacco: Never  Vaping Use   Vaping Use: Never used  Substance Use Topics   Alcohol use: Yes    Alcohol/week: 12.0 - 16.0 standard drinks    Types: 12 - 16 Cans of beer per week    Comment: per week   Drug use: No     Medication list has been reviewed and updated.  Current Meds  Medication Sig   aspirin 81 MG tablet Take 81 mg by mouth daily.   FLUoxetine (PROZAC) 20 MG capsule Take 20 mg by mouth daily.   ibuprofen (ADVIL) 600 MG tablet Take 1 tablet (600 mg total) by mouth  every 6 (six) hours as needed.   omeprazole (PRILOSEC) 20 MG capsule Take 20 mg by mouth daily.   PREVIDENT 5000 DRY MOUTH 1.1 % GEL dental gel    [DISCONTINUED] Calcium Carb-Cholecalciferol (CALCIUM-VITAMIN D3) 600-400 MG-UNIT CAPS Take by mouth.    PHQ 2/9 Scores 05/04/2021 06/09/2017  PHQ - 2 Score 5 4  PHQ- 9 Score 20 14    GAD 7 : Generalized Anxiety Score 05/04/2021  Nervous, Anxious, on Edge 3  Control/stop worrying 3  Worry too much - different things 3  Trouble relaxing 3  Restless 2  Easily annoyed or irritable 3  Afraid - awful might happen 3  Total GAD 7 Score 20  Anxiety Difficulty Somewhat difficult    BP Readings from Last 3  Encounters:  05/04/21 120/80  12/28/20 124/70  07/23/17 114/72    Physical Exam HENT:     Right Ear: Tympanic membrane normal.     Left Ear: Tympanic membrane normal.     Nose: Nose normal.  Cardiovascular:     Heart sounds: S1 normal and S2 normal. No murmur heard. No systolic murmur is present.  No diastolic murmur is present.    No gallop. No S3 or S4 sounds.  Pulmonary:     Breath sounds: No wheezing or rhonchi.  Abdominal:     General: Bowel sounds are normal.  Musculoskeletal:        General: No swelling or tenderness.  Neurological:     Mental Status: She is alert.    Wt Readings from Last 3 Encounters:  05/04/21 176 lb (79.8 kg)  12/28/20 182 lb (82.6 kg)  07/23/17 174 lb (78.9 kg)    BP 120/80    Pulse 64    Ht 5\' 6"  (1.676 m)    Wt 176 lb (79.8 kg)    BMI 28.41 kg/m   Assessment and Plan:  1. Establishing care with new doctor, encounter for Patient establishing care with new physician.  We reviewed health maintenance as well as discussed upcoming test that need to be done.  It was acknowledged that she had a normal colonoscopy that was in 2016 with a 10-year repeat.  It was acknowledged that she has an abnormal vaginal Pap that needs to be follow-up for colposcopy and patient is planning on doing so.  Patient will return in 3 months for recheck and discussion of effects of the low-dose therapy.  2. Anxiety and depression Chronic.  Controlled.  Stable.  PHQ is 20 Gad score is 20 patient is significantly anxiety but is reluctant to do behavioral approach at this time.  We will continue her fluoxetine at 20 mg but we will add buspirone 7.51 twice a day.  Patient will return in 3 months for recheck and discussion on current status. - FLUoxetine (PROZAC) 20 MG capsule; Take 1 capsule (20 mg total) by mouth daily.  Dispense: 90 capsule; Refill: 1 - busPIRone (BUSPAR) 7.5 MG tablet; Take 1 tablet (7.5 mg total) by mouth 2 (two) times daily.  Dispense: 60 tablet; Refill:  2  3. VAIN I (vaginal intraepithelial neoplasia grade I) As noted patient has been evaluated by [facility] and was to have surveillance with colposcopy.  We discussed the importance of this and that we need to follow-up with this at is a matter of catching things at controllable circumstances rather than waiting for them to progress and she understands referral has been made with [facility] for referral. - Ambulatory referral to Gynecology  4.  Encounter for screening mammogram for malignant neoplasm of breast Patient has been given appointment for screening mammogram and [place] and would prefer to do it in this facility and would like to have it after 230 when she gets off work.  5. Gastroesophageal reflux disease, unspecified whether esophagitis present Chronic.  Controlled.  Stable.  Continue omeprazole over-the-counter.

## 2021-05-04 NOTE — Progress Notes (Signed)
Ordered mammo 

## 2021-05-09 NOTE — Progress Notes (Deleted)
Kayla Limerick, MD   No chief complaint on file.   HPI:      Kayla Washington is a 61 y.o. [phone] whose LMP was No LMP recorded. Patient has had a hysterectomy., presents today for NP> 3 yrs pap smear due to hx of VAIN 1 after hyst. Was supposed to do pap/colpo yearly but not done since   VaIN 1 should be subjected to follow-up with a Pap test and colposcopy every six months, and after two years of negative cytology, there is a necessity for Pap test examinations every three years. 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 Arvil Chaco   COLONOSCOPY  05/2014   TUBAL LIGATION      Family History  Problem Relation Age of Onset   Stroke Mother    Diabetes Mother    Colon polyps Mother    Hypertension Father    Heart disease Father    Cancer Father    Heart attack Father    Throat cancer Father    Healthy Brother    Hypertension Other    Hyperlipidemia Other    Heart disease Other    Diabetes Other     Social History   Socioeconomic History   Marital status: Divorced    Spouse name: Not on file   Number of children: 2   Years of education: H/S   Highest education level: High school graduate  Occupational History   Occupation: Tree surgeon at [employer]    Comment: Full-Time  Tobacco Use   Smoking status: Never   Smokeless tobacco: Never  Vaping Use   Vaping Use: Never used  Substance and Sexual Activity    Alcohol use: Yes    Alcohol/week: 12.0 - 16.0 standard drinks    Types: 12 - 16 Cans of beer per week    Comment: per week   Drug use: No   Sexual activity: Not Currently  Other Topics Concern   Not on file  Social History Narrative   Not on file   Social Determinants of Health   Financial Resource Strain: Not on file  Food Insecurity: Not on file  Transportation Needs: Not on file  Physical Activity: Not on file  Stress: Not on file  Social Connections: Not on file  Intimate Partner Violence: Not on file    Outpatient Medications Prior to Visit  Medication Sig Dispense Refill   aspirin 81 MG tablet Take 81 mg by mouth daily.     busPIRone (BUSPAR) 7.5 MG tablet Take 1 tablet (7.5 mg total) by mouth 2 (two) times daily. 60 tablet 2   FLUoxetine (PROZAC) 20 MG capsule Take 1 capsule (20 mg total) by mouth daily. 90 capsule 1   ibuprofen (ADVIL) 600 MG tablet Take 1 tablet (600 mg total) by mouth every 6 (six) hours as needed. 60 tablet 0   omeprazole (PRILOSEC) 20 MG capsule Take 20 mg by mouth  daily.     PREVIDENT 5000 DRY MOUTH 1.1 % GEL dental gel   5   Probiotic Product (PROBIOTIC-10 PO) Take by mouth. (Patient not taking: Reported on 05/04/2021)     No facility-administered medications prior to visit.      ROS:  Review of Systems BREAST: No symptoms   OBJECTIVE:   Vitals:  There were no vitals taken for this visit.  Physical Exam  Results: No results found for this or any previous visit (from the past 24 hour(s)).   Assessment/Plan: No diagnosis found.    No orders of the defined types were placed in this encounter.     No follow-ups on file.  Tarius Stangelo B. Monique Gift, PA-C 05/09/2021 4:48 PM

## 2021-05-10 ENCOUNTER — Ambulatory Visit: Payer: BC Managed Care – PPO | Admitting: Obstetrics and Gynecology

## 2021-06-01 ENCOUNTER — Ambulatory Visit: Payer: BC Managed Care – PPO | Admitting: Family Medicine

## 2021-06-13 ENCOUNTER — Ambulatory Visit: Payer: BC Managed Care – PPO | Admitting: Obstetrics and Gynecology

## 2021-06-26 ENCOUNTER — Other Ambulatory Visit: Payer: Self-pay

## 2021-06-26 ENCOUNTER — Ambulatory Visit
Admission: RE | Admit: 2021-06-26 | Discharge: 2021-06-26 | Disposition: A | Discharge status: Home / Self Care | Payer: BC Managed Care – PPO | Source: Ambulatory Visit | Attending: Family Medicine | Admitting: Family Medicine

## 2021-06-26 DIAGNOSIS — Z1231 Encounter for screening mammogram for malignant neoplasm of breast: Secondary | ICD-10-CM | POA: Insufficient documentation

## 2021-06-29 ENCOUNTER — Inpatient Hospital Stay
Admission: RE | Admit: 2021-06-29 | Discharge: 2021-06-29 | Disposition: A | Discharge status: Home / Self Care | Payer: Self-pay | Source: Ambulatory Visit | Attending: *Deleted | Admitting: *Deleted

## 2021-06-29 ENCOUNTER — Other Ambulatory Visit: Payer: Self-pay | Admitting: *Deleted

## 2021-06-29 DIAGNOSIS — Z1231 Encounter for screening mammogram for malignant neoplasm of breast: Secondary | ICD-10-CM

## 2021-07-02 ENCOUNTER — Encounter: Payer: Self-pay | Admitting: Family Medicine

## 2021-07-02 ENCOUNTER — Ambulatory Visit (INDEPENDENT_AMBULATORY_CARE_PROVIDER_SITE_OTHER): Payer: BC Managed Care – PPO | Admitting: Family Medicine

## 2021-07-02 VITALS — BP 132/82 | HR 76 | Ht 66.0 in | Wt 177.0 lb

## 2021-07-02 DIAGNOSIS — N890 Mild vaginal dysplasia: Secondary | ICD-10-CM

## 2021-07-02 DIAGNOSIS — F32A Depression, unspecified: Secondary | ICD-10-CM

## 2021-07-02 DIAGNOSIS — F419 Anxiety disorder, unspecified: Secondary | ICD-10-CM | POA: Diagnosis not present

## 2021-07-02 MED ORDER — BUSPIRONE HCL 7.5 MG PO TABS: 7.5000 mg | ORAL_TABLET | Freq: Two times a day (BID) | ORAL | 1 refills | Status: DC

## 2021-07-02 MED ORDER — FLUOXETINE HCL 20 MG PO CAPS: 20.0000 mg | ORAL_CAPSULE | Freq: Every day | ORAL | 1 refills | Status: DC

## 2021-07-02 NOTE — Progress Notes (Addendum)
? ? ?Date:  07/02/2021  ? ?Name:  Kayla Washington   DOB:  [birth year]   MRN:  [id number] ? ? ?Chief Complaint: Anxiety ? ?Depression ?       This is a chronic problem.  The current episode started more than 1 year ago.   The onset quality is gradual.   The problem occurs intermittently.  The problem has been rapidly improving since onset.  Associated symptoms include fatigue.  Associated symptoms include no decreased concentration, no helplessness, no hopelessness, does not have insomnia, not irritable, no restlessness, no decreased interest, no appetite change, no body aches, no myalgias, no headaches, no indigestion, not sad and no suicidal ideas.  Past treatments include SSRIs - Selective serotonin reuptake inhibitors.  Compliance with treatment is variable.  Previous treatment provided moderate relief.  Past medical history includes anxiety.   ?Anxiety ?Presents for follow-up visit. Patient reports no chest pain, compulsions, confusion, decreased concentration, depressed mood, dizziness, excessive worry, feeling of choking, insomnia, irritability, malaise, nausea, nervous/anxious behavior, palpitations, panic, restlessness, shortness of breath or suicidal ideas. Symptoms occur rarely. The severity of symptoms is mild.  ? ? ? ?Lab Results  ?Component Value Date  ? NA 137 06/14/2017  ? K 4.2 06/14/2017  ? CO2 23 06/14/2017  ? GLUCOSE 118 (H) 06/14/2017  ? BUN 11 06/14/2017  ? CREATININE 0.75 06/14/2017  ? CALCIUM 9.7 06/14/2017  ? GFRNONAA >60 06/14/2017  ? ?Lab Results  ?Component Value Date  ? CHOL 221 (H) 05/17/2016  ? HDL 69 05/17/2016  ? LDLCALC 134 (H) 05/17/2016  ? TRIG 88 05/17/2016  ? CHOLHDL 3.2 05/17/2016  ? ?Lab Results  ?Component Value Date  ? TSH 1.843 06/14/2017  ? ?No results found for: HGBA1C ?Lab Results  ?Component Value Date  ? WBC 6.3 06/14/2017  ? HGB 13.6 06/14/2017  ? HCT 41.2 06/14/2017  ? MCV 83.6 06/14/2017  ? PLT 345 06/14/2017  ? ?Lab Results  ?Component Value Date  ? ALT 29 06/14/2017   ? AST 33 06/14/2017  ? ALKPHOS 97 06/14/2017  ? BILITOT 0.6 06/14/2017  ? ?No results found for: 25OHVITD2, 25OHVITD3, VD25OH  ? ?Review of Systems  ?Constitutional:  Positive for fatigue. Negative for appetite change, chills, fever and irritability.  ?HENT:  Negative for drooling, ear discharge, ear pain and sore throat.   ?Respiratory:  Negative for cough, shortness of breath and wheezing.   ?Cardiovascular:  Negative for chest pain, palpitations and leg swelling.  ?Gastrointestinal:  Negative for abdominal pain, blood in stool, constipation, diarrhea and nausea.  ?Endocrine: Negative for polydipsia.  ?Genitourinary:  Negative for dysuria, frequency, hematuria and urgency.  ?Musculoskeletal:  Negative for back pain, myalgias and neck pain.  ?Skin:  Negative for rash.  ?Allergic/Immunologic: Negative for environmental allergies.  ?Neurological:  Negative for dizziness and headaches.  ?Hematological:  Does not bruise/bleed easily.  ?Psychiatric/Behavioral:  Positive for depression. Negative for confusion, decreased concentration and suicidal ideas. The patient is not nervous/anxious and does not have insomnia.   ? ?Patient Active Problem List  ? Diagnosis Date Noted  ? VAIN I (vaginal intraepithelial neoplasia grade I) 07/23/2017  ? S/P hysterectomy 07/11/2017  ? LGSIL on Pap smear of cervix 06/09/2017  ? Arthritis 11/04/2014  ? Anxiety 10/11/2014  ? 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 Adaptation reaction 04/18/2008  ? Alcohol drinker 04/18/2006  ? Menopausal and perimenopausal disorder 04/18/2006  ? ? ?No Known Allergies ? ?Past Surgical History:  ?Procedure Laterality Date  ?  ABDOMINAL HYSTERECTOMY  05/2011  ? Still has left ovary;  Dr. Arvil ChacoVan Dalen  ? COLONOSCOPY  05/2014  ? TUBAL LIGATION    ? ? ?Social History  ? ?Tobacco Use  ? Smoking status: Never  ? Smokeless tobacco: Never  ?Vaping Use  ? Vaping Use: Never used  ?Substance Use Topics  ? Alcohol use: Yes  ?  Alcohol/week: 12.0 - 16.0 standard  drinks  ?  Types: 12 - 16 Cans of beer per week  ?  Comment: per week  ? Drug use: No  ? ? ? ?Medication list has been reviewed and updated. ? ?Current Meds  ?Medication Sig  ? aspirin 81 MG tablet Take 81 mg by mouth daily.  ? busPIRone (BUSPAR) 7.5 MG tablet Take 1 tablet (7.5 mg total) by mouth 2 (two) times daily.  ? FLUoxetine (PROZAC) 20 MG capsule Take 1 capsule (20 mg total) by mouth daily.  ? ibuprofen (ADVIL) 600 MG tablet Take 1 tablet (600 mg total) by mouth every 6 (six) hours as needed.  ? omeprazole (PRILOSEC) 20 MG capsule Take 20 mg by mouth daily.  ? PREVIDENT 5000 DRY MOUTH 1.1 % GEL dental gel   ? ? ? ?  07/02/2021  ?  2:41 PM 05/04/2021  ?  2:26 PM  ?GAD 7 : Generalized Anxiety Score  ?Nervous, Anxious, on Edge 0 3  ?Control/stop worrying 0 3  ?Worry too much - different things 0 3  ?Trouble relaxing 0 3  ?Restless 0 2  ?Easily annoyed or irritable 0 3  ?Afraid - awful might happen 0 3  ?Total GAD 7 Score 0 20  ?Anxiety Difficulty Not difficult at all Somewhat difficult  ? ? ? ?  07/02/2021  ?  2:41 PM  ?Depression screen PHQ 2/9  ?Decreased Interest 0  ?Down, Depressed, Hopeless 0  ?PHQ - 2 Score 0  ?Altered sleeping 1  ?Tired, decreased energy 0  ?Change in appetite 0  ?Feeling bad or failure about yourself  0  ?Trouble concentrating 0  ?Moving slowly or fidgety/restless 0  ?Suicidal thoughts 0  ?PHQ-9 Score 1  ?Difficult doing work/chores Not difficult at all  ? ? ?BP Readings from Last 3 Encounters:  ?07/02/21 132/82  ?05/04/21 120/80  ?12/28/20 124/70  ? ? ?Physical Exam ?Constitutional:   ?   General: She is not irritable. ?   Appearance: She is well-developed.  ?HENT:  ?   Head: Normocephalic.  ?   Right Ear: External ear normal.  ?   Left Ear: External ear normal.  ?Eyes:  ?   General: Lids are everted, no foreign bodies appreciated. No scleral icterus.    ?   Left eye: No foreign body or hordeolum.  ?   Conjunctiva/sclera: Conjunctivae normal.  ?   Right eye: Right conjunctiva is not  injected.  ?   Left eye: Left conjunctiva is not injected.  ?   Pupils: Pupils are equal, round, and reactive to light.  ?Neck:  ?   Thyroid: No thyromegaly.  ?   Vascular: No JVD.  ?   Trachea: No tracheal deviation.  ?Cardiovascular:  ?   Rate and Rhythm: Normal rate and regular rhythm.  ?   Heart sounds: Normal heart sounds, S1 normal and S2 normal. No murmur heard. ?  No friction rub. No gallop. No S3 or S4 sounds.  ?Pulmonary:  ?   Effort: Pulmonary effort is normal. No respiratory distress.  ?   Breath sounds: Normal  breath sounds. No wheezing, rhonchi or rales.  ?Abdominal:  ?   General: Bowel sounds are normal.  ?   Palpations: Abdomen is soft. There is no mass.  ?   Tenderness: There is no abdominal tenderness. There is no guarding or rebound.  ?Musculoskeletal:     ?   General: No tenderness. Normal range of motion.  ?   Cervical back: Normal range of motion and neck supple.  ?Lymphadenopathy:  ?   Cervical: No cervical adenopathy.  ?Skin: ?   General: Skin is warm.  ?   Findings: No rash.  ?Neurological:  ?   Mental Status: She is alert and oriented to person, place, and time.  ?   Cranial Nerves: No cranial nerve deficit.  ?   Deep Tendon Reflexes: Reflexes normal.  ?Psychiatric:     ?   Mood and Affect: Mood is not anxious or depressed.  ? ? ?Wt Readings from Last 3 Encounters:  ?07/02/21 177 lb (80.3 kg)  ?05/04/21 176 lb (79.8 kg)  ?12/28/20 182 lb (82.6 kg)  ? ? ?BP 132/82   Pulse 76   Ht 5\' 6"  (1.676 m)   Wt 177 lb (80.3 kg)   SpO2 98%   BMI 28.57 kg/m?  ? ?Assessment and Plan: ? ?1. Anxiety and depression ?Chronic.  Controlled.  PHQ went from 22-1.  And gad score went from 20-0 with the addition of buspirone 7.5 twice a day.  We will continue with fluoxetine 20 mg and current dosing of buspirone and will recheck patient in 6 months ?- busPIRone (BUSPAR) 7.5 MG tablet; Take 1 tablet (7.5 mg total) by mouth 2 (two) times daily.  Dispense: 180 tablet; Refill: 1 ?- FLUoxetine (PROZAC) 20 MG  capsule; Take 1 capsule (20 mg total) by mouth daily.  Dispense: 90 capsule; Refill: 1 ? ?2. VAIN I (vaginal intraepithelial neoplasia grade I) ?Previously evaluated by [facility] outpatient is to call and verify that sh

## 2021-07-11 ENCOUNTER — Telehealth: Payer: Self-pay | Admitting: Family Medicine

## 2021-07-11 NOTE — Telephone Encounter (Signed)
Copied from CRM [id number]. Topic: General - Other ?>> Jul 11, 2021  9:06 AM Kayla Washington A wrote: ?Reason for CRM: Patient called in to ask Dr Yetta Barre nurse to give her a call to go over her mammogram result she does not have access to My Chart and does not know how to log on can be reached at Ph# [phone] ?

## 2021-09-14 ENCOUNTER — Encounter: Payer: Self-pay | Admitting: Family Medicine

## 2021-09-14 ENCOUNTER — Ambulatory Visit: Payer: BC Managed Care – PPO | Admitting: Family Medicine

## 2021-09-14 VITALS — BP 122/86 | HR 80 | Ht 66.0 in | Wt 175.0 lb

## 2021-09-14 DIAGNOSIS — K644 Residual hemorrhoidal skin tags: Secondary | ICD-10-CM

## 2021-09-14 DIAGNOSIS — R87612 Low grade squamous intraepithelial lesion on cytologic smear of cervix (LGSIL): Secondary | ICD-10-CM

## 2021-09-14 MED ORDER — HYDROCORTISONE (PERIANAL) 2.5 % EX CREA: 1.0000 | TOPICAL_CREAM | Freq: Two times a day (BID) | CUTANEOUS | 0 refills | Status: DC

## 2021-09-14 NOTE — Progress Notes (Signed)
Date:  09/14/2021   Name:  Kayla Washington   DOB:  14-Nov-1960   MRN:  [id number]   Chief Complaint: Hemorrhoids (Been out for about a week- using otc and not helping)  Patient is a 61 year old female who presents for a rectal exam. The patient reports the following problems: hemorrhoid. Health maintenance has been reviewed need cervical cancer surveillance.      Lab Results  Component Value Date   NA 137 06/14/2017   K 4.2 06/14/2017   CO2 23 06/14/2017   GLUCOSE 118 (H) 06/14/2017   BUN 11 06/14/2017   CREATININE 0.75 06/14/2017   CALCIUM 9.7 06/14/2017   GFRNONAA >60 06/14/2017   Lab Results  Component Value Date   CHOL 221 (H) 05/17/2016   HDL 69 05/17/2016   LDLCALC 134 (H) 05/17/2016   TRIG 88 05/17/2016   CHOLHDL 3.2 05/17/2016   Lab Results  Component Value Date   TSH 1.843 06/14/2017   No results found for: "HGBA1C" Lab Results  Component Value Date   WBC 6.3 06/14/2017   HGB 13.6 06/14/2017   HCT 41.2 06/14/2017   MCV 83.6 06/14/2017   PLT 345 06/14/2017   Lab Results  Component Value Date   ALT 29 06/14/2017   AST 33 06/14/2017   ALKPHOS 97 06/14/2017   BILITOT 0.6 06/14/2017   No results found for: "25OHVITD2", "25OHVITD3", "VD25OH"   Review of Systems  Gastrointestinal:  Negative for anal bleeding, blood in stool, constipation, diarrhea and rectal pain.    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 Arvil Chaco   COLONOSCOPY  05/2014   TUBAL  LIGATION      Social History   Tobacco Use   Smoking status: Never   Smokeless tobacco: Never  Vaping Use   Vaping Use: Never used  Substance Use Topics   Alcohol use: Yes    Alcohol/week: 12.0 - 16.0 standard drinks of alcohol    Types: 12 - 16 Cans of beer per week    Comment: per week   Drug use: No     Medication list has been reviewed and updated.  Current Meds  Medication Sig   aspirin 81 MG tablet Take 81 mg by mouth daily.   busPIRone (BUSPAR) 7.5 MG tablet Take 1 tablet (7.5 mg total) by mouth 2 (two) times daily.   FLUoxetine (PROZAC) 20 MG capsule Take 1 capsule (20 mg total) by mouth daily.   hydrocortisone (ANUSOL-HC) 2.5 % rectal cream Place 1 Application rectally 2 (two) times daily.   ibuprofen (ADVIL) 600 MG tablet Take 1 tablet (600 mg total) by mouth every 6 (six) hours as needed.   omeprazole (PRILOSEC) 20 MG capsule Take 20 mg by mouth daily.   [DISCONTINUED] PREVIDENT 5000 DRY MOUTH 1.1 % GEL dental gel    [DISCONTINUED] Probiotic Product (PROBIOTIC-10 PO) Take by mouth.       09/14/2021   11:14 AM 07/02/2021    2:41 PM 05/04/2021  2:26 PM  GAD 7 : Generalized Anxiety Score  Nervous, Anxious, on Edge 0 0 3  Control/stop worrying 0 0 3  Worry too much - different things 0 0 3  Trouble relaxing 0 0 3  Restless 1 0 2  Easily annoyed or irritable 0 0 3  Afraid - awful might happen 0 0 3  Total GAD 7 Score 1 0 20  Anxiety Difficulty Not difficult at all Not difficult at all Somewhat difficult       09/14/2021   11:14 AM  Depression screen PHQ 2/9  Decreased Interest 1  Down, Depressed, Hopeless 1  PHQ - 2 Score 2  Altered sleeping 2  Tired, decreased energy 2  Change in appetite 1  Feeling bad or failure about yourself  0  Trouble concentrating 1  Moving slowly or fidgety/restless 0  Suicidal thoughts 0  PHQ-9 Score 8  Difficult doing work/chores Somewhat difficult    BP Readings from Last 3 Encounters:  09/14/21 122/86  07/02/21  132/82  05/04/21 120/80    Physical Exam Genitourinary:    Rectum: Guaiac result negative. External hemorrhoid present. No mass, tenderness, anal fissure or internal hemorrhoid. Normal anal tone.     Wt Readings from Last 3 Encounters:  09/14/21 175 lb (79.4 kg)  07/02/21 177 lb (80.3 kg)  05/04/21 176 lb (79.8 kg)    BP 122/86   Pulse 80   Ht 5\' 6"  (1.676 m)   Wt 175 lb (79.4 kg)   BMI 28.25 kg/m   Assessment and Plan:  1. 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  Patient has history of abnormal Pap for which we will refer to gynecology for surveillance. - Ambulatory referral to Gynecology  2. External hemorrhoid Chronic.  Recent exacerbation of with some swelling I suspect may have been either due to some sludging or may be bit thrombosis but currently is without thrombosis tenderness bleeding.  Patient has been given information on doing sitz bath and we will use Anusol HC 2.5 to reduce swelling furthermore. - hydrocortisone (ANUSOL-HC) 2.5 % rectal cream; Place 1 Application rectally 2 (two) times daily.  Dispense: 30 g; Refill: 0

## 2021-09-14 NOTE — Patient Instructions (Signed)

## 2021-10-12 ENCOUNTER — Other Ambulatory Visit (HOSPITAL_COMMUNITY)
Admission: RE | Admit: 2021-10-12 | Discharge: 2021-10-12 | Disposition: A | Discharge status: Home / Self Care | Payer: BC Managed Care – PPO | Source: Ambulatory Visit | Attending: Licensed Practical Nurse | Admitting: Licensed Practical Nurse

## 2021-10-12 ENCOUNTER — Encounter: Payer: Self-pay | Admitting: Licensed Practical Nurse

## 2021-10-12 ENCOUNTER — Ambulatory Visit: Payer: BC Managed Care – PPO | Admitting: Licensed Practical Nurse

## 2021-10-12 VITALS — BP 110/70 | Ht 67.0 in | Wt 178.0 lb

## 2021-10-12 DIAGNOSIS — Z124 Encounter for screening for malignant neoplasm of cervix: Secondary | ICD-10-CM

## 2021-10-12 DIAGNOSIS — Z01419 Encounter for gynecological examination (general) (routine) without abnormal findings: Secondary | ICD-10-CM | POA: Insufficient documentation

## 2021-10-12 NOTE — Progress Notes (Unsigned)
Gynecology Annual Exam  PCP: Duanne Limerick, MD  Chief Complaint:  Chief Complaint  Patient presents with   Referral    pap    History of Present Illness:Patient is a 61 y.o. G2P2002 presents for annual exam. The patient has no complaints today. She is here because she is aware needs a pap.   LMP: No LMP recorded. Patient has had a hysterectomy. Postcoital Bleeding: no   The patient is not sexually active. She denies dyspareunia.  The patient does perform self breast exams.  There is no notable family history of breast or ovarian cancer in her family.  The patient wears seatbelts: yes.   The patient has regular exercise:  tries  .   Lives with her 2 daughters Works for [employer] in [employer], has the summer off.  Describes stress as "7/10" as she has many worries about work and family etc Likes to do word searches, spend time on her phone and stays busy to decrease stress.  Last Dental exam 1 year ago Wears glasses, eye exam up to date   The patient denies current symptoms of depression.   Hx depression and anxiety noted in chart   Review of Systems: Review of Systems  Constitutional: Negative.   Eyes: Negative.   Respiratory: Negative.    Cardiovascular: Negative.   Gastrointestinal: Negative.   Genitourinary:  Positive for frequency.       Admits to having increasing her PO intake of water while she has been home, is sure she is using the bathroom more frequently for this reason.   Neurological: Negative.   Endo/Heme/Allergies:  Bruises/bleeds easily.       Hot/cold intolerance   Psychiatric/Behavioral: Negative.      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     Menopausal and perimenopausal disorder 04/18/2006    Past Surgical History:  Past Surgical History:  Procedure Laterality Date   ABDOMINAL HYSTERECTOMY  05/2011   Still has left ovary;  Dr. Arvil Chaco   COLONOSCOPY  05/2014   TUBAL LIGATION      Gynecologic History:  No LMP recorded. Patient has had a hysterectomy. Last Pap: Results were: 2019 low-grade squamous intraepithelial neoplasia (LGSIL - encompassing HPV,mild dysplasia,CIN I)  Last mammogram: 05/2021 Results were: BI-RAD I  Obstetric History: [id number]  Family History:  Family History  Problem Relation Age of Onset   Stroke Mother    Diabetes Mother    Colon polyps Mother    Hypertension Father    Heart disease Father    Heart attack Father    Throat cancer Father    Healthy Brother    Hypertension Other    Hyperlipidemia Other    Heart disease Other    Diabetes Other    Breast cancer Neg Hx     Social History:  Social History   Socioeconomic History   Marital status: Divorced    Spouse name: Not on file   Number of children: 2   Years of education: H/S   Highest education level: High school graduate  Occupational History   Occupation: Tree surgeon at [employer]    Comment: Full-Time  Tobacco Use   Smoking status: Never   Smokeless tobacco: Never  Vaping Use   Vaping Use: Never used  Substance and Sexual Activity   Alcohol use: Yes    Alcohol/week: 12.0 - 16.0 standard drinks of alcohol    Types: 12 - 16 Cans of beer per week    Comment: per week   Drug use: No   Sexual activity: Not Currently    Birth control/protection: Surgical    Comment: Hysterectomy  Other Topics Concern   Not on file  Social History Narrative   Not on file   Social Determinants of Health   Financial Resource Strain: Low Risk  (06/09/2017)   Overall Financial Resource Strain (CARDIA)    Difficulty of Paying  Living Expenses: Not hard at all  Food Insecurity: No Food Insecurity (06/09/2017)   Hunger Vital Sign    Worried About Running Out of Food in the Last Year: Never true    Ran Out of Food in the Last Year: Never true  Transportation Needs: No Transportation Needs (06/09/2017)   PRAPARE - Administrator, Civil Service (Medical): No    Lack of Transportation (Non-Medical): No  Physical Activity: Inactive (06/09/2017)   Exercise Vital Sign    Days of Exercise per Week: 0 days    Minutes of Exercise per Session: 0 min  Stress: Not on file  Social Connections: Not on file  Intimate Partner Violence: Not on file    Allergies:  No Known Allergies  Medications: Prior to Admission medications   Medication Sig Start Date End Date Taking? Authorizing Provider  busPIRone (BUSPAR) 7.5 MG tablet Take 1 tablet (7.5 mg total) by mouth 2 (two) times daily. 07/02/21  Yes Duanne Limerick, MD  FLUoxetine (PROZAC) 20 MG capsule Take 1 capsule (20 mg total) by mouth daily. 07/02/21  Yes Duanne Limerick, MD  ibuprofen (ADVIL) 600 MG tablet Take 1 tablet (600 mg total) by mouth every 6 (six) hours as needed. 12/28/20  Yes Schuman, Christanna R, MD  omeprazole (PRILOSEC) 20 MG capsule Take 20 mg by mouth daily.   Yes [provider]    Physical Exam Vitals: Blood pressure 110/70, height 5\' 7"  (1.702 m), weight 178 lb (80.7 kg).  General: NAD HEENT: normocephalic, anicteric Thyroid: no enlargement, no palpable nodules Pulmonary: No increased work of breathing, CTAB Cardiovascular: RRR, distal pulses 2+ Breast: Declined as she had a recent mammogram  Abdomen: NABS, soft, non-tender, non-distended.  Umbilicus without lesions.  No hepatomegaly, splenomegaly or masses palpable. No evidence of hernia  Genitourinary:  External: Normal external female genitalia.  Normal urethral meatus, normal Bartholin's and Skene's glands.    Vagina: Normal vaginal mucosa, no evidence of prolapse.    Cervix:  surgically absent   Uterus: surgically absent   Adnexa: Left ovary surgically absent, Right  ovat non-enlarged, no adnexal masses  Rectal: deferred  Lymphatic: no evidence of inguinal lymphadenopathy Extremities: no edema, erythema, or tenderness Neurologic: Grossly intact Psychiatric: mood appropriate, affect full   Assessment: 61 y.o. G2P2002 routine annual exam  Plan: Problem List Items Addressed This Visit   None   1) Mammogram - recommend yearly screening mammogram.  Mammogram Is up to date  2) STI screening  wasoffered and declined  3) ASCCP guidelines and rational discussed.  Patient opts for every 5 years screening interval  4) Osteoporosis managed by PCP  -  Consider FDA-approved medical therapies in postmenopausal women and men aged 60 years and older, based on the following: a) A hip or vertebral (clinical or morphometric) fracture b) T-score ? -2.5 at the femoral neck or spine after appropriate evaluation to exclude secondary causes C) Low bone mass (T-score between -1.0 and -2.5 at the femoral neck or spine) and a 10-year probability of a hip fracture ? 3% or a 10-year probability of a major osteoporosis-related fracture ? 20% based on the [place]-adapted WHO algorithm   5) Routine healthcare maintenance including cholesterol, diabetes screening discussed managed by PCP  6) Colonoscopy 2016, ordered today .  Screening recommended starting at age 51 for average risk individuals, age 25 for individuals deemed at increased risk (including African Americans) and recommended to continue until age 2.  For patient age 61-85 individualized approach is recommended.  Gold standard screening is via colonoscopy, Cologuard screening is an acceptable alternative for patient unwilling or unable to undergo colonoscopy.  "Colorectal cancer screening for average?risk adults: 2018 guideline update from the American Cancer Society"CA: A Cancer Journal for Clinicians: Aug 28, 2016   7) No  follow-ups on file.   Carie Caddy, CNM  [facility], [facility] 10/12/2021, 9:44 AM

## 2021-10-16 ENCOUNTER — Ambulatory Visit: Payer: BC Managed Care – PPO | Admitting: Family Medicine

## 2021-10-18 LAB — CYTOLOGY - PAP
Adequacy: ABSENT
Comment: NEGATIVE
Diagnosis: UNDETERMINED — AB
High risk HPV: NEGATIVE

## 2021-10-28 ENCOUNTER — Encounter: Payer: Self-pay | Admitting: Licensed Practical Nurse

## 2021-11-05 ENCOUNTER — Other Ambulatory Visit (HOSPITAL_COMMUNITY): Payer: Self-pay | Admitting: Family Medicine

## 2021-11-05 ENCOUNTER — Other Ambulatory Visit: Payer: Self-pay | Admitting: Family Medicine

## 2021-11-05 DIAGNOSIS — S20211S Contusion of right front wall of thorax, sequela: Secondary | ICD-10-CM

## 2022-01-01 ENCOUNTER — Ambulatory Visit: Payer: BC Managed Care – PPO | Admitting: Family Medicine

## 2022-01-04 ENCOUNTER — Other Ambulatory Visit: Payer: Self-pay | Admitting: Family Medicine

## 2022-01-04 DIAGNOSIS — F419 Anxiety disorder, unspecified: Secondary | ICD-10-CM

## 2022-01-04 MED ORDER — BUSPIRONE HCL 7.5 MG PO TABS: 7.5000 mg | ORAL_TABLET | Freq: Two times a day (BID) | ORAL | 0 refills | Status: DC

## 2022-01-04 NOTE — Telephone Encounter (Signed)
Requested Prescriptions  Pending Prescriptions Disp Refills  . busPIRone (BUSPAR) 7.5 MG tablet 180 tablet 0    Sig: Take 1 tablet (7.5 mg total) by mouth 2 (two) times daily.     Psychiatry: Anxiolytics/Hypnotics - Non-controlled Passed - 01/04/2022  3:02 PM      Passed - Valid encounter within last 12 months    Recent Outpatient Visits          3 months ago LGSIL on Pap smear of cervix   [facility] [place], Deanna C, MD   6 months ago Anxiety and depression   [facility] [place], [place], MD   8 months ago Anxiety and depression   [facility] [place], Deanna C, MD   4 years ago Encounter for annual physical exam   [facility] [place], Dionne Bucy, MD   5 years ago Chest pain, unspecified type   [place], PA-C      Future Appointments            In 1 week Juline Patch, MD [place] at [facility], [place]   In 6 months Juline Patch, MD [place] [facility] at [facility], [facility]

## 2022-01-04 NOTE — Telephone Encounter (Signed)
Medication Refill - Medication: Buspirone 7.5 mg bid  Has the patient contacted their pharmacy? Yes.   (Agent: If no, request that the patient contact the pharmacy for the refill. If patient does not wish to contact the pharmacy document the reason why and proceed with request.) (Agent: If yes, when and what did the pharmacy advise?)  Preferred Pharmacy (with phone number or street name): [facility]  [address] Has the patient been seen for an appointment in the last year OR does the patient have an upcoming appointment? Yes.    Agent: Please be advised that RX refills may take up to 3 business days. We ask that you follow-up with your pharmacy.

## 2022-01-17 ENCOUNTER — Ambulatory Visit: Payer: BC Managed Care – PPO | Admitting: Family Medicine

## 2022-01-17 ENCOUNTER — Encounter: Payer: Self-pay | Admitting: Family Medicine

## 2022-01-17 VITALS — BP 124/78 | HR 68 | Ht 67.0 in | Wt 182.0 lb

## 2022-01-17 DIAGNOSIS — F419 Anxiety disorder, unspecified: Secondary | ICD-10-CM

## 2022-01-17 DIAGNOSIS — E7801 Familial hypercholesterolemia: Secondary | ICD-10-CM | POA: Diagnosis not present

## 2022-01-17 DIAGNOSIS — R739 Hyperglycemia, unspecified: Secondary | ICD-10-CM

## 2022-01-17 DIAGNOSIS — F32A Depression, unspecified: Secondary | ICD-10-CM

## 2022-01-17 MED ORDER — BUSPIRONE HCL 15 MG PO TABS: 15.0000 mg | ORAL_TABLET | Freq: Two times a day (BID) | ORAL | 1 refills | Status: DC

## 2022-01-17 MED ORDER — FLUOXETINE HCL 20 MG PO CAPS: 20.0000 mg | ORAL_CAPSULE | Freq: Every day | ORAL | 1 refills | Status: DC

## 2022-01-17 MED ORDER — BUSPIRONE HCL 7.5 MG PO TABS: 7.5000 mg | ORAL_TABLET | Freq: Two times a day (BID) | ORAL | 1 refills | Status: DC

## 2022-01-17 NOTE — Progress Notes (Signed)
Date:  01/17/2022   Name:  Kayla Washington   DOB:  08-26-1960   MRN:  [id number]   Chief Complaint: Depression (0 and 3) and Anxiety  Depression        This is a chronic problem.  The current episode started more than 1 year ago.   The problem has been gradually improving since onset.  Associated symptoms include no decreased concentration, no fatigue, no helplessness, no hopelessness, does not have insomnia, not irritable, no restlessness, no decreased interest, no appetite change, no body aches, no myalgias, no headaches, no indigestion, not sad and no suicidal ideas.  Past treatments include SSRIs - Selective serotonin reuptake inhibitors.  Compliance with treatment is good.  Previous treatment provided mild relief.  Past medical history includes anxiety.   Anxiety Presents for follow-up visit. Symptoms include depressed mood, excessive worry and nervous/anxious behavior. Patient reports no chest pain, compulsions, confusion, decreased concentration, dizziness, dry mouth, feeling of choking, hyperventilation, insomnia, irritability, malaise, muscle tension, nausea, palpitations, restlessness, shortness of breath or suicidal ideas. Symptoms occur occasionally. The severity of symptoms is mild.      Lab Results  Component Value Date   NA 137 06/14/2017   K 4.2 06/14/2017   CO2 23 06/14/2017   GLUCOSE 118 (H) 06/14/2017   BUN 11 06/14/2017   CREATININE 0.75 06/14/2017   CALCIUM 9.7 06/14/2017   GFRNONAA >60 06/14/2017   Lab Results  Component Value Date   CHOL 221 (H) 05/17/2016   HDL 69 05/17/2016   LDLCALC 134 (H) 05/17/2016   TRIG 88 05/17/2016   CHOLHDL 3.2 05/17/2016   Lab Results  Component Value Date   TSH 1.843 06/14/2017   No results found for: "HGBA1C" Lab Results  Component Value Date   WBC 6.3 06/14/2017   HGB 13.6 06/14/2017   HCT 41.2 06/14/2017   MCV 83.6 06/14/2017   PLT 345 06/14/2017   Lab Results  Component Value Date   ALT 29 06/14/2017    AST 33 06/14/2017   ALKPHOS 97 06/14/2017   BILITOT 0.6 06/14/2017   No results found for: "25OHVITD2", "25OHVITD3", "VD25OH"   Review of Systems  Constitutional: Negative.  Negative for appetite change, chills, fatigue, fever, irritability and unexpected weight change.  HENT:  Negative for congestion, ear discharge, ear pain, rhinorrhea, sinus pressure, sneezing and sore throat.   Respiratory:  Negative for cough, shortness of breath, wheezing and stridor.   Cardiovascular:  Negative for chest pain and palpitations.  Gastrointestinal:  Negative for abdominal pain, blood in stool, constipation, diarrhea and nausea.  Genitourinary:  Negative for dysuria, flank pain, frequency, hematuria, urgency and vaginal discharge.  Musculoskeletal:  Negative for arthralgias, back pain and myalgias.  Skin:  Negative for rash.  Neurological:  Negative for dizziness, weakness and headaches.  Hematological:  Negative for adenopathy. Does not bruise/bleed easily.  Psychiatric/Behavioral:  Positive for depression. Negative for confusion, decreased concentration, dysphoric mood and suicidal ideas. The patient is nervous/anxious. The patient does not have insomnia.     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 Arvil Chaco   COLONOSCOPY  05/2014   TUBAL LIGATION      Social History   Tobacco Use   Smoking status: Never   Smokeless tobacco: Never  Vaping Use   Vaping Use: Never used  Substance Use Topics    Alcohol use: Yes    Alcohol/week: 12.0 - 16.0 standard drinks of alcohol    Types: 12 - 16 Cans of beer per week    Comment: per week   Drug use: No     Medication list has been reviewed and updated.  Current Meds  Medication Sig   aspirin EC 81 MG tablet Take 81 mg by mouth daily. Swallow whole.   busPIRone (BUSPAR) 7.5 MG tablet Take 1 tablet (7.5 mg total) by mouth 2 (two) times daily.   FLUoxetine (PROZAC) 20 MG capsule Take 1 capsule (20 mg total) by mouth daily.   ibuprofen (ADVIL) 600 MG tablet Take 1 tablet (600 mg total) by mouth every 6 (six) hours as needed.   omeprazole (PRILOSEC) 20 MG capsule Take 20 mg by mouth daily.       01/17/2022    2:15 PM 09/14/2021   11:14 AM 07/02/2021    2:41 PM 05/04/2021    2:26 PM  GAD 7 : Generalized Anxiety Score  Nervous, Anxious, on Edge 1 0 0 3  Control/stop worrying 0 0 0 3  Worry too much - different things 1 0 0 3  Trouble relaxing 0 0 0 3  Restless 0 1 0 2  Easily annoyed or irritable 0 0 0 3  Afraid - awful might happen 1 0 0 3  Total GAD 7 Score 3 1 0 20  Anxiety Difficulty Somewhat difficult Not difficult at all Not difficult at all Somewhat difficult       01/17/2022    2:15 PM 09/14/2021   11:14 AM 07/02/2021    2:41 PM  Depression screen PHQ 2/9  Decreased Interest 0 1 0  Down, Depressed, Hopeless 0 1 0  PHQ - 2 Score 0 2 0  Altered sleeping 0 2 1  Tired, decreased energy 0 2 0  Change in appetite 0 1 0  Feeling bad or failure about yourself  0 0 0  Trouble concentrating 0 1 0  Moving slowly or fidgety/restless 0 0 0  Suicidal thoughts 0 0 0  PHQ-9 Score 0 8 1  Difficult doing work/chores Not difficult at all Somewhat difficult Not difficult at all    BP Readings from Last 3 Encounters:  01/17/22 124/78  10/12/21 110/70  09/14/21 122/86    Physical Exam Vitals and nursing note reviewed.  Constitutional:      General: She is not irritable.    Appearance: She is well-developed.  HENT:     Head:  Normocephalic.     Right Ear: Tympanic membrane and external ear normal.     Left Ear: Tympanic membrane and external ear normal.  Eyes:     General: Lids are everted, no foreign bodies appreciated. No scleral icterus.       Left eye: No foreign body or hordeolum.     Conjunctiva/sclera: Conjunctivae normal.     Right eye: Right conjunctiva is not injected.     Left eye: Left conjunctiva is not injected.     Pupils: Pupils are equal, round, and reactive to light.  Neck:     Thyroid: No  thyromegaly.     Vascular: No JVD.     Trachea: No tracheal deviation.  Cardiovascular:     Rate and Rhythm: Normal rate and regular rhythm.     Heart sounds: Normal heart sounds. No murmur heard.    No friction rub. No gallop.  Pulmonary:     Effort: Pulmonary effort is normal. No respiratory distress.     Breath sounds: Normal breath sounds. No wheezing, rhonchi or rales.  Abdominal:     General: Bowel sounds are normal.     Palpations: Abdomen is soft. There is no mass.     Tenderness: There is no abdominal tenderness. There is no guarding or rebound.  Musculoskeletal:        General: No tenderness. Normal range of motion.     Cervical back: Normal range of motion and neck supple.  Lymphadenopathy:     Cervical: No cervical adenopathy.  Skin:    General: Skin is warm.     Findings: No rash.  Neurological:     Mental Status: She is alert and oriented to person, place, and time.     Cranial Nerves: No cranial nerve deficit.     Deep Tendon Reflexes: Reflexes normal.  Psychiatric:        Mood and Affect: Mood is anxious. Mood is not depressed.     Wt Readings from Last 3 Encounters:  01/17/22 182 lb (82.6 kg)  10/12/21 178 lb (80.7 kg)  09/14/21 175 lb (79.4 kg)    BP 124/78   Pulse 68   Ht 5\' 7"  (1.702 m)   Wt 182 lb (82.6 kg)   BMI 28.51 kg/m   Assessment and Plan:  1. Anxiety and depression Chronic.  Controlled.  Stable.  Depression/PHQ score is 0 GAD score is 3 we will  continue fluoxetine 20 mg once a day and will increase buspirone to 15 mg twice a day.  We will recheck patient in 4 to 6 months as needed. - FLUoxetine (PROZAC) 20 MG capsule; Take 1 capsule (20 mg total) by mouth daily.  Dispense: 90 capsule; Refill: 1  2. Familial hypercholesterolemia Chronic.  Controlled.  Stable.  Elevated lipid was noted on previous panel and will repeat fasting. - Lipid Panel With LDL/HDL Ratio  3. Hyperglycemia Chronic.  Controlled.  Stable mild elevation of glucose previous panel and we will recheck comprehensive metabolic panel. - Comprehensive Metabolic Panel (CMET)    , MD

## 2022-01-18 LAB — COMPREHENSIVE METABOLIC PANEL
ALT: 24 IU/L (ref 0–32)
AST: 21 IU/L (ref 0–40)
Albumin/Globulin Ratio: 1.8 (ref 1.2–2.2)
Albumin: 4.4 g/dL (ref 3.9–4.9)
Alkaline Phosphatase: 119 IU/L (ref 44–121)
BUN/Creatinine Ratio: 29 — ABNORMAL HIGH (ref 12–28)
BUN: 20 mg/dL (ref 8–27)
Bilirubin Total: <0.2 mg/dL (ref 0.0–1.2)
CO2: 22 mmol/L (ref 20–29)
Calcium: 9.5 mg/dL (ref 8.7–10.3)
Chloride: 103 mmol/L (ref 96–106)
Creatinine, Ser: 0.7 mg/dL (ref 0.57–1.00)
Globulin, Total: 2.5 g/dL (ref 1.5–4.5)
Glucose: 95 mg/dL (ref 70–99)
Potassium: 4.5 mmol/L (ref 3.5–5.2)
Sodium: 141 mmol/L (ref 134–144)
Total Protein: 6.9 g/dL (ref 6.0–8.5)
eGFR: 98 mL/min/{1.73_m2} (ref >59)

## 2022-01-18 LAB — LIPID PANEL WITH LDL/HDL RATIO
Cholesterol, Total: 243 mg/dL — ABNORMAL HIGH (ref 100–199)
HDL: 68 mg/dL (ref >39)
LDL Chol Calc (NIH): 159 mg/dL — ABNORMAL HIGH (ref 0–99)
LDL/HDL Ratio: 2.3 ratio (ref 0.0–3.2)
Triglycerides: 92 mg/dL (ref 0–149)
VLDL Cholesterol Cal: 16 mg/dL (ref 5–40)

## 2022-07-03 ENCOUNTER — Ambulatory Visit: Payer: BC Managed Care – PPO | Admitting: Family Medicine

## 2022-07-04 ENCOUNTER — Ambulatory Visit: Payer: BC Managed Care – PPO | Admitting: Family Medicine

## 2022-07-05 ENCOUNTER — Encounter: Payer: Self-pay | Admitting: Family Medicine

## 2022-07-05 ENCOUNTER — Ambulatory Visit: Payer: BC Managed Care – PPO | Admitting: Family Medicine

## 2022-07-05 VITALS — BP 120/76 | HR 60 | Ht 67.0 in | Wt 183.0 lb

## 2022-07-05 DIAGNOSIS — F32A Anxiety disorder, unspecified: Secondary | ICD-10-CM

## 2022-07-05 DIAGNOSIS — F419 Anxiety disorder, unspecified: Secondary | ICD-10-CM

## 2022-07-05 MED ORDER — BUSPIRONE HCL 15 MG PO TABS: 15.0000 mg | ORAL_TABLET | Freq: Two times a day (BID) | ORAL | 1 refills | Status: DC

## 2022-07-05 MED ORDER — FLUOXETINE HCL 20 MG PO CAPS: 20.0000 mg | ORAL_CAPSULE | Freq: Every day | ORAL | 1 refills | Status: DC

## 2022-07-05 NOTE — Progress Notes (Signed)
Date:  07/05/2022   Name:  Kayla Washington   DOB:  Aug 31, 1960   MRN:  [id number]   Chief Complaint: Depression and Anxiety  Depression        This is a chronic problem.  The current episode started more than 1 year ago.   The problem has been gradually improving since onset.  Associated symptoms include no decreased concentration, no fatigue, no helplessness, no hopelessness, does not have insomnia, not irritable, no restlessness, no decreased interest, no appetite change, no body aches, no myalgias, no headaches, no indigestion, not sad and no suicidal ideas.     The symptoms are aggravated by nothing.  Past treatments include SSRIs - Selective serotonin reuptake inhibitors.  Previous treatment provided significant relief.  Past medical history includes anxiety.   Anxiety Presents for follow-up visit. Patient reports no chest pain, decreased concentration, depressed mood, excessive worry, insomnia, nervous/anxious behavior, palpitations, panic, restlessness, shortness of breath or suicidal ideas.      Lab Results  Component Value Date   NA 141 01/17/2022   K 4.5 01/17/2022   CO2 22 01/17/2022   GLUCOSE 95 01/17/2022   BUN 20 01/17/2022   CREATININE 0.70 01/17/2022   CALCIUM 9.5 01/17/2022   EGFR 98 01/17/2022   GFRNONAA >60 06/14/2017   Lab Results  Component Value Date   CHOL 243 (H) 01/17/2022   HDL 68 01/17/2022   LDLCALC 159 (H) 01/17/2022   TRIG 92 01/17/2022   CHOLHDL 3.2 05/17/2016   Lab Results  Component Value Date   TSH 1.843 06/14/2017   No results found for: "HGBA1C" Lab Results  Component Value Date   WBC 6.3 06/14/2017   HGB 13.6 06/14/2017   HCT 41.2 06/14/2017   MCV 83.6 06/14/2017   PLT 345 06/14/2017   Lab Results  Component Value Date   ALT 24 01/17/2022   AST 21 01/17/2022   ALKPHOS 119 01/17/2022   BILITOT <0.2 01/17/2022   No results found for: "25OHVITD2", "25OHVITD3", "VD25OH"   Review of Systems  Constitutional:  Negative for  appetite change and fatigue.  HENT:  Negative for rhinorrhea, sinus pressure and sinus pain.   Eyes:  Negative for visual disturbance.  Respiratory:  Negative for chest tightness and shortness of breath.   Cardiovascular:  Negative for chest pain, palpitations and leg swelling.  Gastrointestinal:  Negative for abdominal pain.  Musculoskeletal:  Negative for myalgias.  Neurological:  Negative for headaches.  Psychiatric/Behavioral:  Positive for depression. Negative for decreased concentration and suicidal ideas. The patient is not nervous/anxious and does not have insomnia.     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 Arvil Chaco   COLONOSCOPY  05/2014   TUBAL LIGATION      Social History   Tobacco Use   Smoking status: Never   Smokeless tobacco: Never  Vaping Use   Vaping Use: Never used  Substance Use Topics   Alcohol use: Yes    Alcohol/week: 12.0 - 16.0  standard drinks of alcohol    Types: 12 - 16 Cans of beer per week    Comment: per week   Drug use: No     Medication list has been reviewed and updated.  Current Meds  Medication Sig   aspirin EC 81 MG tablet Take 81 mg by mouth daily. Swallow whole.   busPIRone (BUSPAR) 15 MG tablet Take 1 tablet (15 mg total) by mouth 2 (two) times daily.   FLUoxetine (PROZAC) 20 MG capsule Take 1 capsule (20 mg total) by mouth daily.   ibuprofen (ADVIL) 600 MG tablet Take 1 tablet (600 mg total) by mouth every 6 (six) hours as needed.    omeprazole (PRILOSEC) 20 MG capsule Take 20 mg by mouth daily.       07/05/2022    2:25 PM 01/17/2022    2:15 PM 09/14/2021   11:14 AM 07/02/2021    2:41 PM  GAD 7 : Generalized Anxiety Score  Nervous, Anxious, on Edge 0 1 0 0  Control/stop worrying 0 0 0 0  Worry too much - different things 0 1 0 0  Trouble relaxing 0 0 0 0  Restless 0 0 1 0  Easily annoyed or irritable 0 0 0 0  Afraid - awful might happen 0 1 0 0  Total GAD 7 Score 0 3 1 0  Anxiety Difficulty Not difficult at all Somewhat difficult Not difficult at all Not difficult at all       07/05/2022    2:25 PM 01/17/2022    2:15 PM 09/14/2021   11:14 AM  Depression screen PHQ 2/9  Decreased Interest 0 0 1  Down, Depressed, Hopeless 0 0 1  PHQ - 2 Score 0 0 2  Altered sleeping 0 0 2  Tired, decreased energy 0 0 2  Change in appetite 0 0 1  Feeling bad or failure about yourself  0 0 0  Trouble concentrating 0 0 1  Moving slowly or fidgety/restless 0 0 0  Suicidal thoughts 0 0 0  PHQ-9 Score 0 0 8  Difficult doing work/chores Not difficult at all Not difficult at all Somewhat difficult    BP Readings from Last 3 Encounters:  07/05/22 120/76  01/17/22 124/78  10/12/21 110/70    Physical Exam Vitals and nursing note reviewed. Exam conducted with a chaperone present.  Constitutional:      General: She is not irritable.She is not in acute distress.    Appearance: She is not diaphoretic.  HENT:     Head: Normocephalic and atraumatic.     Right Ear: Tympanic membrane and external ear normal.     Left Ear: Tympanic membrane and external ear normal.     Nose: Nose normal.     Mouth/Throat:     Mouth: Mucous membranes are moist.  Eyes:     General:        Right eye: No discharge.        Left eye: No discharge.     Conjunctiva/sclera: Conjunctivae normal.     Pupils: Pupils are equal, round, and reactive to light.  Neck:     Thyroid: No thyromegaly.     Vascular: No JVD.  Cardiovascular:     Rate and Rhythm:  Normal rate and regular rhythm.     Heart sounds: Normal heart sounds. No murmur heard.    No friction rub. No gallop.  Pulmonary:     Effort: Pulmonary effort is normal.     Breath  sounds: Normal breath sounds. No wheezing, rhonchi or rales.  Abdominal:     General: Bowel sounds are normal.     Palpations: Abdomen is soft. There is no mass.     Tenderness: There is no abdominal tenderness. There is no guarding.  Musculoskeletal:        General: Normal range of motion.     Cervical back: Neck supple.  Lymphadenopathy:     Cervical: No cervical adenopathy.  Skin:    General: Skin is warm and dry.  Neurological:     Mental Status: She is alert.     Wt Readings from Last 3 Encounters:  07/05/22 183 lb (83 kg)  01/17/22 182 lb (82.6 kg)  10/12/21 178 lb (80.7 kg)    BP 120/76   Pulse 60   Ht 5\' 7"  (1.702 m)   Wt 183 lb (83 kg)   SpO2 98%   BMI 28.66 kg/m   Assessment and Plan: 1. Anxiety and depression Chronic.  Controlled.  Stable.  PHQ 0 GAD score is 0 continue buspirone 15 mg 1 twice a day and fluoxetine 20 mg once a day. - busPIRone (BUSPAR) 15 MG tablet; Take 1 tablet (15 mg total) by mouth 2 (two) times daily.  Dispense: 180 tablet; Refill: 1 - FLUoxetine (PROZAC) 20 MG capsule; Take 1 capsule (20 mg total) by mouth daily.  Dispense: 90 capsule; Refill: 1   Will recheck patient for hyperlipidemia in 4 months at which time she will be fasting.  Elizabeth Sauereanna Koen Antilla, MD

## 2022-11-08 ENCOUNTER — Ambulatory Visit: Payer: BC Managed Care – PPO | Admitting: Family Medicine

## 2022-11-08 ENCOUNTER — Encounter: Payer: Self-pay | Admitting: Family Medicine

## 2022-11-08 VITALS — BP 134/62 | HR 69 | Ht 67.0 in | Wt 183.0 lb

## 2022-11-08 DIAGNOSIS — F32A Depression, unspecified: Secondary | ICD-10-CM | POA: Diagnosis not present

## 2022-11-08 DIAGNOSIS — E7801 Familial hypercholesterolemia: Secondary | ICD-10-CM

## 2022-11-08 DIAGNOSIS — F419 Anxiety disorder, unspecified: Secondary | ICD-10-CM

## 2022-11-08 MED ORDER — FLUOXETINE HCL 20 MG PO CAPS
20.0000 mg | ORAL_CAPSULE | Freq: Every day | ORAL | 1 refills | Status: DC
Start: 2022-11-08 — End: 2023-05-01

## 2022-11-08 MED ORDER — BUSPIRONE HCL 15 MG PO TABS
15.0000 mg | ORAL_TABLET | Freq: Two times a day (BID) | ORAL | Status: DC
Start: 2022-11-08 — End: 2023-02-13

## 2022-11-08 NOTE — Progress Notes (Signed)
Date:  11/08/2022   Name:  Kayla Washington   DOB:  September 04, 1960   MRN:  [id number]   Chief Complaint: Depression  Depression        This is a chronic problem.  The current episode started more than 1 year ago.   The onset quality is gradual.   The problem has been gradually improving since onset.  Associated symptoms include no decreased concentration, no fatigue, no helplessness, no hopelessness, does not have insomnia, not irritable, no restlessness, no decreased interest, no appetite change, no body aches, no myalgias, no headaches, no indigestion, not sad and no suicidal ideas.     The symptoms are aggravated by nothing.  Past treatments include other medications (buspirone). Hyperlipidemia This is a chronic problem. The current episode started more than 1 year ago. The problem is uncontrolled. Pertinent negatives include no chest pain, myalgias or shortness of breath. Current antihyperlipidemic treatment includes diet change. The current treatment provides moderate improvement of lipids. Compliance problems include adherence to exercise.     Lab Results  Component Value Date   NA 141 01/17/2022   K 4.5 01/17/2022   CO2 22 01/17/2022   GLUCOSE 95 01/17/2022   BUN 20 01/17/2022   CREATININE 0.70 01/17/2022   CALCIUM 9.5 01/17/2022   EGFR 98 01/17/2022   GFRNONAA >60 06/14/2017   Lab Results  Component Value Date   CHOL 243 (H) 01/17/2022   HDL 68 01/17/2022   LDLCALC 159 (H) 01/17/2022   TRIG 92 01/17/2022   CHOLHDL 3.2 05/17/2016   Lab Results  Component Value Date   TSH 1.843 06/14/2017   No results found for: "HGBA1C" Lab Results  Component Value Date   WBC 6.3 06/14/2017   HGB 13.6 06/14/2017   HCT 41.2 06/14/2017   MCV 83.6 06/14/2017   PLT 345 06/14/2017   Lab Results  Component Value Date   ALT 24 01/17/2022   AST 21 01/17/2022   ALKPHOS 119 01/17/2022   BILITOT <0.2 01/17/2022   No results found for: "25OHVITD2", "25OHVITD3", "VD25OH"   Review of  Systems  Constitutional:  Negative for appetite change, fatigue and fever.  HENT:  Negative for nosebleeds and sinus pressure.   Respiratory:  Negative for chest tightness, shortness of breath and wheezing.   Cardiovascular:  Negative for chest pain, palpitations and leg swelling.  Gastrointestinal:  Negative for abdominal pain.  Endocrine: Negative for polydipsia and polyuria.  Musculoskeletal:  Negative for myalgias.  Neurological:  Negative for weakness and headaches.  Hematological:  Negative for adenopathy. Does not bruise/bleed easily.  Psychiatric/Behavioral:  Positive for depression. Negative for decreased concentration and suicidal ideas. The patient does not have insomnia.     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 Arvil Chaco   COLONOSCOPY  05/2014   TUBAL LIGATION      Social History   Tobacco Use   Smoking status: Never   Smokeless tobacco: Never  Vaping Use   Vaping status: Never  Used  Substance Use Topics   Alcohol use: Yes    Alcohol/week: 12.0 - 16.0 standard drinks of alcohol    Types: 12 - 16 Cans of beer per week    Comment: per week   Drug use: No     Medication list has been reviewed and updated.  Current Meds  Medication Sig   aspirin EC 81 MG tablet Take 81 mg by mouth daily. Swallow whole.   busPIRone (BUSPAR) 15 MG tablet Take 1 tablet (15 mg total) by mouth 2 (two) times daily.   FLUoxetine (PROZAC) 20 MG capsule Take 1 capsule (20 mg total) by mouth  daily.   ibuprofen (ADVIL) 600 MG tablet Take 1 tablet (600 mg total) by mouth every 6 (six) hours as needed.       07/05/2022    2:25 PM 01/17/2022    2:15 PM 09/14/2021   11:14 AM 07/02/2021    2:41 PM  GAD 7 : Generalized Anxiety Score  Nervous, Anxious, on Edge 0 1 0 0  Control/stop worrying 0 0 0 0  Worry too much - different things 0 1 0 0  Trouble relaxing 0 0 0 0  Restless 0 0 1 0  Easily annoyed or irritable 0 0 0 0  Afraid - awful might happen 0 1 0 0  Total GAD 7 Score 0 3 1 0  Anxiety Difficulty Not difficult at all Somewhat difficult Not difficult at all Not difficult at all       07/05/2022    2:25 PM 01/17/2022    2:15 PM 09/14/2021   11:14 AM  Depression screen PHQ 2/9  Decreased Interest 0 0 1  Down, Depressed, Hopeless 0 0 1  PHQ - 2 Score 0 0 2  Altered sleeping 0 0 2  Tired, decreased energy 0 0 2  Change in appetite 0 0 1  Feeling bad or failure about yourself  0 0 0  Trouble concentrating 0 0 1  Moving slowly or fidgety/restless 0 0 0  Suicidal thoughts 0 0 0  PHQ-9 Score 0 0 8  Difficult doing work/chores Not difficult at all Not difficult at all Somewhat difficult    BP Readings from Last 3 Encounters:  11/08/22 134/62  07/05/22 120/76  01/17/22 124/78    Physical Exam Vitals and nursing note reviewed. Exam conducted with a chaperone present.  Constitutional:      General: She is not irritable.She is not in acute distress.    Appearance: She is not diaphoretic.  HENT:     Head: Normocephalic and atraumatic.     Right Ear: Tympanic membrane and external ear normal.     Left Ear: Tympanic membrane and external ear normal.     Nose: Nose normal.  Eyes:     General:        Right eye: No discharge.        Left eye: No discharge.     Conjunctiva/sclera: Conjunctivae normal.     Pupils: Pupils are equal, round, and reactive to light.  Neck:     Thyroid: No thyromegaly.     Vascular: No JVD.  Cardiovascular:     Rate and Rhythm: Normal rate  and regular rhythm.     Heart sounds: Normal heart sounds. No murmur heard.    No friction rub. No gallop.  Pulmonary:     Effort: Pulmonary effort is normal.     Breath sounds: Normal breath sounds. No wheezing, rhonchi or rales.  Abdominal:     General: Bowel sounds are normal.     Palpations: Abdomen is soft. There is no hepatomegaly or splenomegaly.  Musculoskeletal:     Cervical back: Normal range of motion and neck supple.  Lymphadenopathy:     Cervical: No cervical adenopathy.  Skin:    General: Skin is warm.  Neurological:     Mental Status: She is alert.     Wt Readings from Last 3 Encounters:  11/08/22 183 lb (83 kg)  07/05/22 183 lb (83 kg)  01/17/22 182 lb (82.6 kg)    BP 134/62   Pulse 69   Ht 5\' 7"  (1.702 m)   Wt 183 lb (83 kg)   SpO2 99%   BMI 28.66 kg/m   Assessment and Plan:  1. Anxiety and depression Chronic.  Controlled.  Stable.  PHQ is 0.  GAD score is 0.  Continue buspirone 15 mg twice a day and fluoxetine 20 mg once a day.  Will recheck in 6 months. - busPIRone (BUSPAR) 15 MG tablet; Take 1 tablet (15 mg total) by mouth 2 (two) times daily. - FLUoxetine (PROZAC) 20 MG capsule; Take 1 capsule (20 mg total) by mouth daily.  Dispense: 90 capsule; Refill: 1  2. Familial hypercholesterolemia Chronic.  Controlled.  Stable.  Approaches with the diet control and we have reemphasized low-cholesterol low triglyceride dietary guidelines.  Will check lipid panel and CMP for current status of control. - Lipid Panel With LDL/HDL Ratio - Comprehensive Metabolic Panel (CMET)    Elizabeth Sauer, MD

## 2022-11-08 NOTE — Patient Instructions (Signed)

## 2022-11-17 IMAGING — MG MM DIGITAL SCREENING BILAT W/ TOMO AND CAD
8 series · 8 of 24 positions shown · non-contrast
Comparison: Previous exam(s).

CLINICAL DATA: Screening.

EXAM:
DIGITAL SCREENING BILATERAL MAMMOGRAM WITH TOMOSYNTHESIS AND CAD
TECHNIQUE: Bilateral screening digital craniocaudal and mediolateral oblique
mammograms were obtained. Bilateral screening digital breast
tomosynthesis was performed. The images were evaluated with
computer-aided detection.

[L MLO synth-2D]
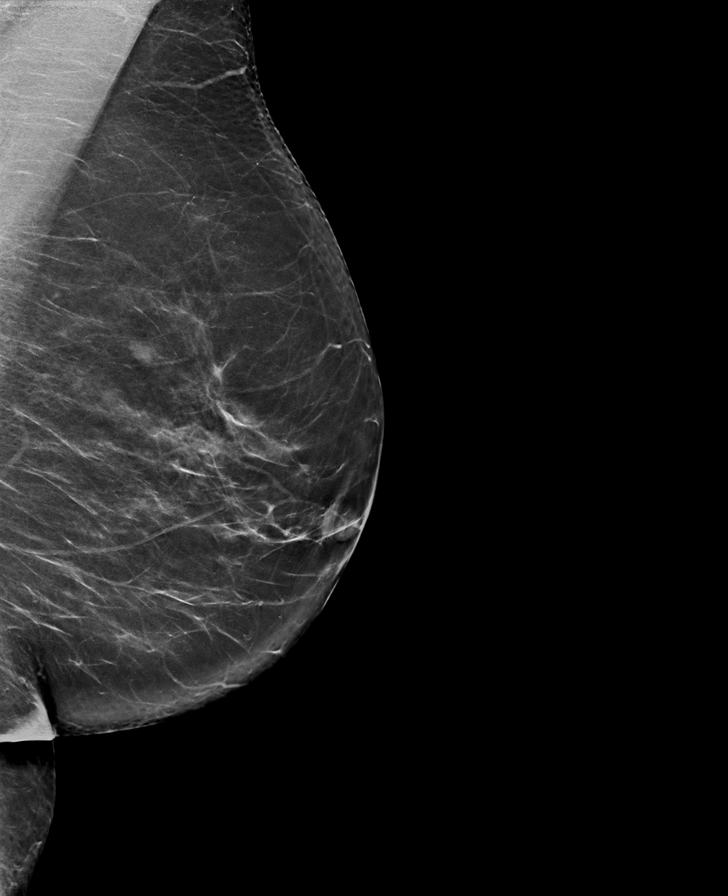

[L CC synth-2D]
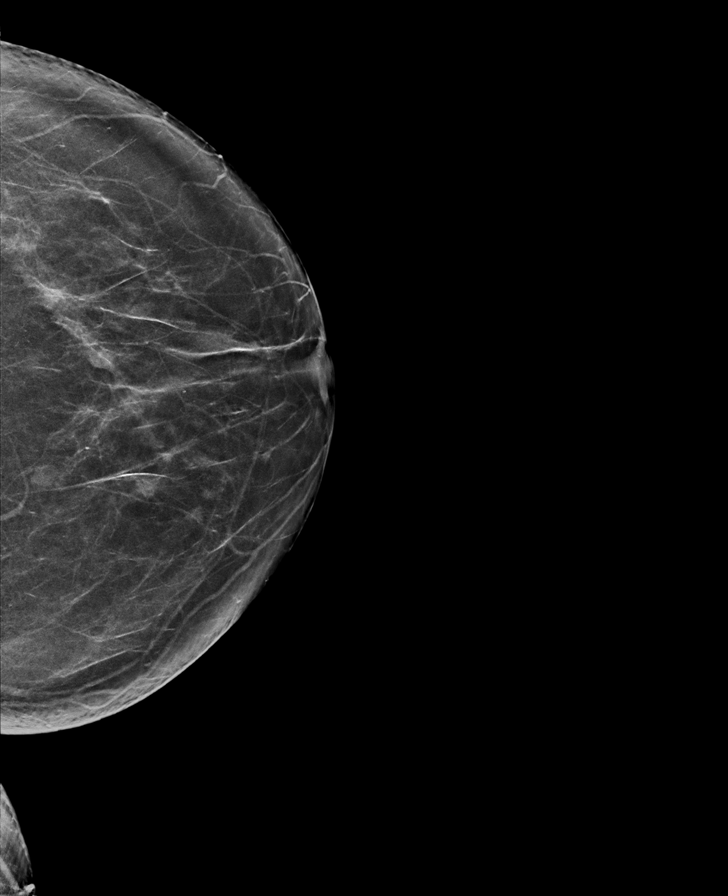

[R CC synth-2D]
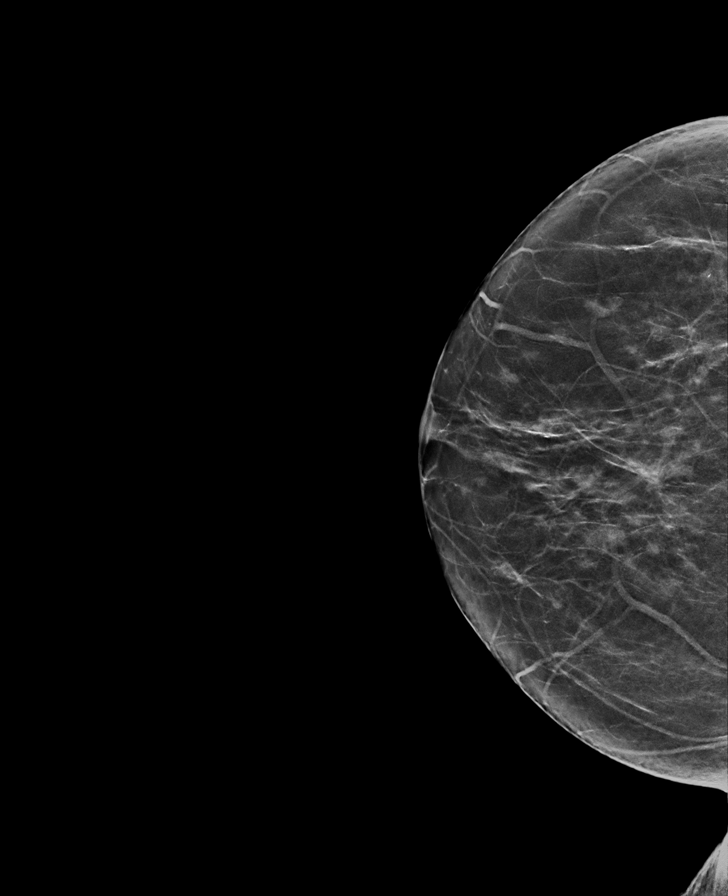

[R MLO synth-2D]
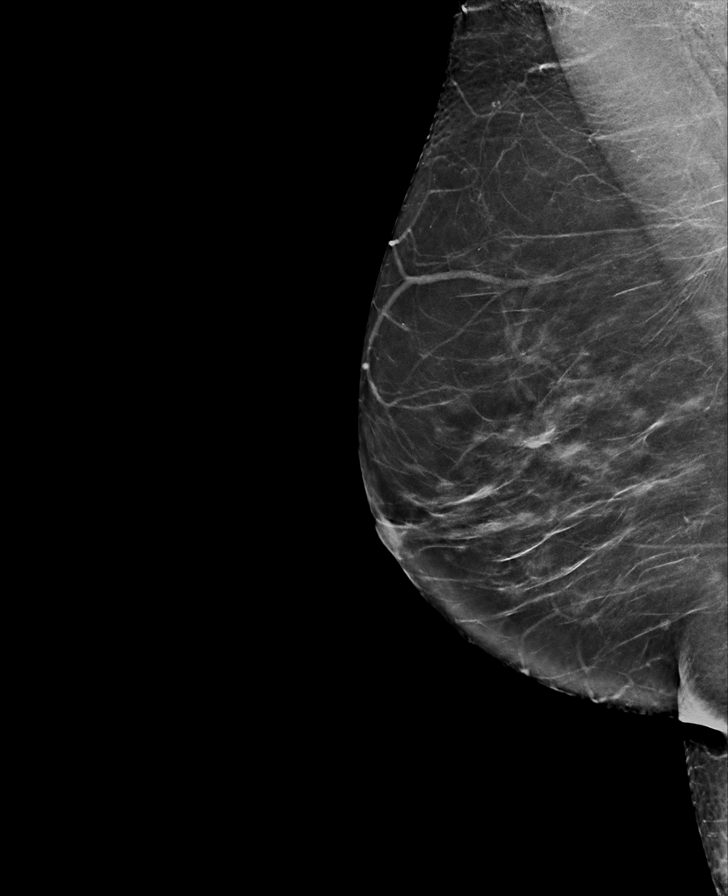

[L MLO tomo · tomo slice 37/74.0]
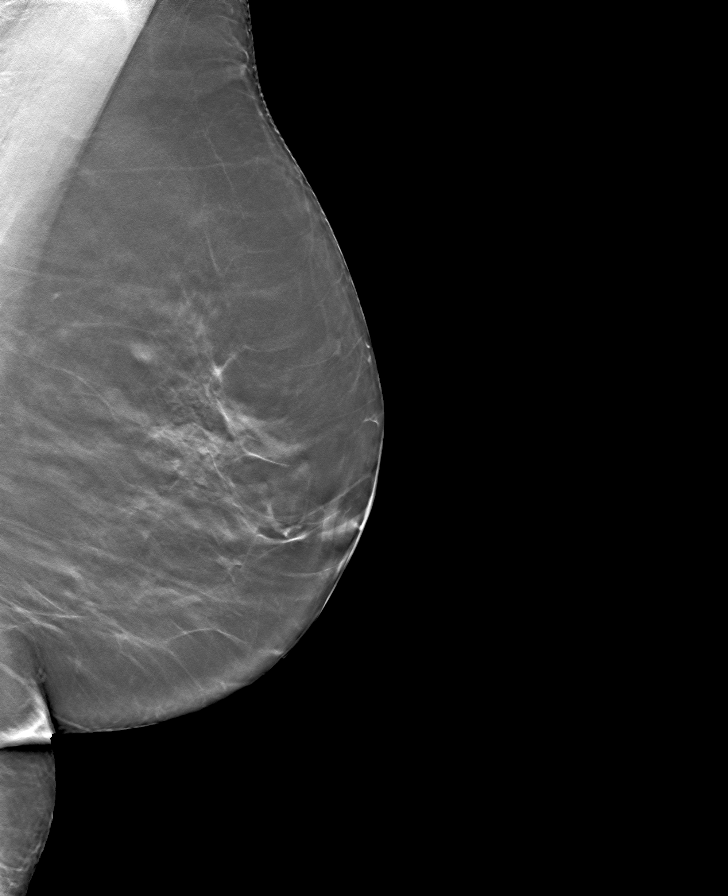

[R MLO tomo · tomo slice 37/73.0]
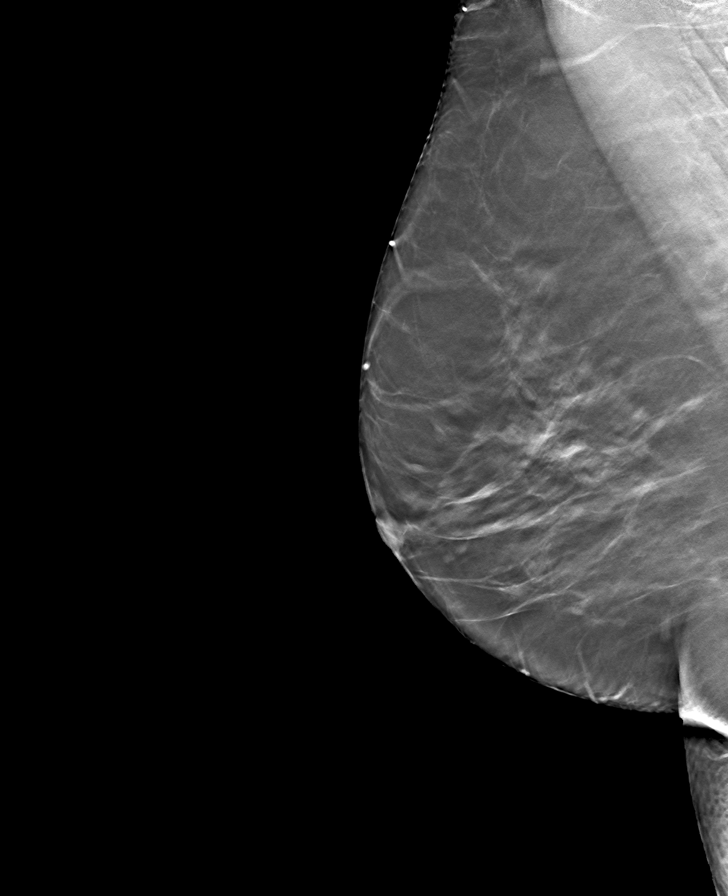

[L CC tomo · tomo slice 35/70.0]
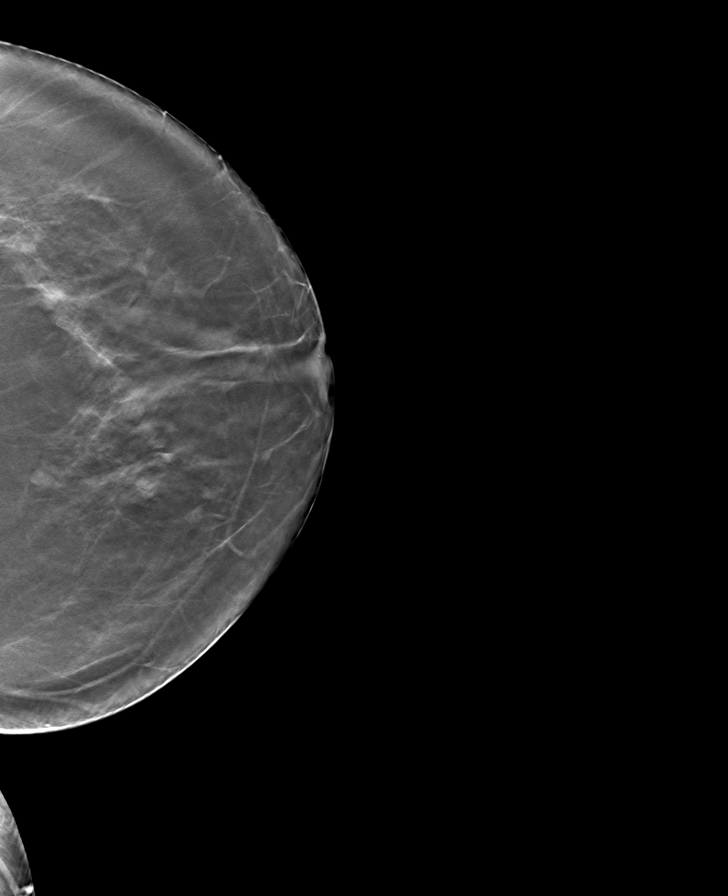

[R CC tomo · tomo slice 31/62.0]
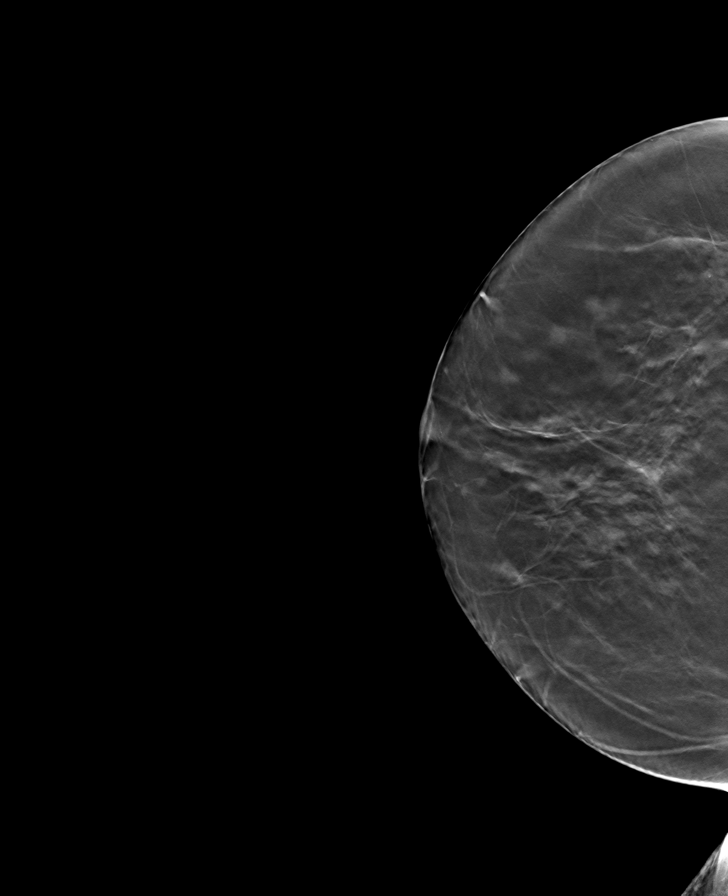

[8 of 24 positions shown; findings below may reference images not displayed]

ACR Breast Density Category b: There are scattered areas of
fibroglandular density.
FINDINGS: There are no findings suspicious for malignancy.
IMPRESSION: No mammographic evidence of malignancy. A result letter of this
screening mammogram will be mailed directly to the patient.

RECOMMENDATION:
Screening mammogram in one year. (Code:51-O-LD2)

BI-RADS CATEGORY  1: Negative.

## 2023-02-12 ENCOUNTER — Other Ambulatory Visit: Payer: Self-pay | Admitting: Family Medicine

## 2023-02-12 DIAGNOSIS — F419 Anxiety disorder, unspecified: Secondary | ICD-10-CM

## 2023-02-13 ENCOUNTER — Other Ambulatory Visit: Payer: Self-pay

## 2023-02-13 NOTE — Telephone Encounter (Signed)
Requested medication (s) are due for refill today: yes   Requested medication (s) are on the active medication list: yes   Last refill:  11/08/22  Future visit scheduled: yes in 2 months   Notes to clinic:   no amount given to dispense. Please reorder how much to dispense and refill?     Requested Prescriptions  Pending Prescriptions Disp Refills   busPIRone (BUSPAR) 15 MG tablet [Pharmacy Med Name: busPIRone HCl 15 MG Oral Tablet] 180 tablet 0    Sig: Take 1 tablet by mouth twice daily     Psychiatry: Anxiolytics/Hypnotics - Non-controlled Passed - 02/12/2023  2:45 PM      Passed - Valid encounter within last 12 months    Recent Outpatient Visits           3 months ago Anxiety and depression   [facility]   7 months ago Anxiety and depression   [place] [facility]   1 year ago Anxiety and depression   [place] [facility]   1 year ago LGSIL on Pap smear of cervix   [place] [facility]   1 year ago Anxiety and depression   [facility]       Future Appointments             In 2 months Duanne Limerick, MD [facility] at [facility], [facility]

## 2023-04-22 ENCOUNTER — Ambulatory Visit: Payer: BC Managed Care – PPO | Admitting: Family Medicine

## 2023-04-23 ENCOUNTER — Other Ambulatory Visit: Payer: Self-pay | Admitting: Family Medicine

## 2023-04-23 DIAGNOSIS — F32A Depression, unspecified: Secondary | ICD-10-CM

## 2023-04-25 ENCOUNTER — Ambulatory Visit: Payer: BC Managed Care – PPO | Admitting: Family Medicine

## 2023-04-30 ENCOUNTER — Other Ambulatory Visit: Payer: Self-pay

## 2023-05-01 ENCOUNTER — Encounter: Payer: Self-pay | Admitting: Family Medicine

## 2023-05-01 ENCOUNTER — Ambulatory Visit: Payer: 59 | Admitting: Family Medicine

## 2023-05-01 VITALS — BP 112/78 | HR 70 | Ht 67.0 in | Wt 187.0 lb

## 2023-05-01 DIAGNOSIS — F419 Anxiety disorder, unspecified: Secondary | ICD-10-CM

## 2023-05-01 DIAGNOSIS — F32A Depression, unspecified: Secondary | ICD-10-CM

## 2023-05-01 MED ORDER — BUSPIRONE HCL 15 MG PO TABS
15.0000 mg | ORAL_TABLET | Freq: Two times a day (BID) | ORAL | 0 refills | Status: DC
Start: 2023-05-01 — End: 2023-05-29

## 2023-05-01 MED ORDER — FLUOXETINE HCL 20 MG PO CAPS
20.0000 mg | ORAL_CAPSULE | Freq: Every day | ORAL | 1 refills | Status: DC
Start: 2023-05-01 — End: 2023-05-29

## 2023-05-01 MED ORDER — FLUOXETINE HCL 10 MG PO TABS: ORAL_TABLET | ORAL | 3 refills | Status: DC

## 2023-05-01 NOTE — Progress Notes (Signed)
Date:  05/01/2023   Name:  Kayla Washington   DOB:  [birth year]   MRN:  [id number]   Chief Complaint: anxiety and depression   Depression        This is a chronic problem.  The current episode started more than 1 year ago.   The onset quality is gradual.   The problem has been gradually improving since onset.  Associated symptoms include decreased concentration, fatigue, insomnia and appetite change.  Associated symptoms include no myalgias, no headaches and no suicidal ideas.     The symptoms are aggravated by work stress.  Past treatments include SSRIs - Selective serotonin reuptake inhibitors.  Compliance with treatment is variable.  Past medical history includes anxiety.   Anxiety Presents for follow-up visit. Symptoms include decreased concentration, excessive worry, insomnia, irritability and nervous/anxious behavior. Patient reports no confusion, dizziness, nausea, shortness of breath or suicidal ideas. Symptoms occur most days.      Lab Results  Component Value Date   NA 139 11/08/2022   K 4.5 11/08/2022   CO2 23 11/08/2022   GLUCOSE 96 11/08/2022   BUN 11 11/08/2022   CREATININE 0.69 11/08/2022   CALCIUM 9.6 11/08/2022   EGFR 98 11/08/2022   GFRNONAA >60 06/14/2017   Lab Results  Component Value Date   CHOL 230 (H) 11/08/2022   HDL 59 11/08/2022   LDLCALC 153 (H) 11/08/2022   TRIG 100 11/08/2022   CHOLHDL 3.2 05/17/2016   Lab Results  Component Value Date   TSH 1.843 06/14/2017   No results found for: "HGBA1C" Lab Results  Component Value Date   WBC 6.3 06/14/2017   HGB 13.6 06/14/2017   HCT 41.2 06/14/2017   MCV 83.6 06/14/2017   PLT 345 06/14/2017   Lab Results  Component Value Date   ALT 30 11/08/2022   AST 27 11/08/2022   ALKPHOS 124 (H) 11/08/2022   BILITOT 0.3 11/08/2022   No results found for: "25OHVITD2", "25OHVITD3", "VD25OH"   Review of Systems  Constitutional:  Positive for appetite change, fatigue and irritability. Negative for  chills, fever and unexpected weight change.  HENT:  Negative for congestion, ear discharge, ear pain, rhinorrhea, sinus pressure, sneezing and sore throat.   Respiratory:  Negative for cough, shortness of breath, wheezing and stridor.   Gastrointestinal:  Negative for abdominal pain, blood in stool, constipation, diarrhea and nausea.  Genitourinary:  Negative for dysuria, flank pain, frequency, hematuria, urgency and vaginal discharge.  Musculoskeletal:  Negative for arthralgias, back pain and myalgias.  Skin:  Negative for rash.  Neurological:  Negative for dizziness, weakness and headaches.  Hematological:  Negative for adenopathy (fluox). Does not bruise/bleed easily.  Psychiatric/Behavioral:  Positive for decreased concentration and depression. Negative for confusion, dysphoric mood and suicidal ideas. The patient is nervous/anxious and has insomnia.     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 Arvil Chaco   COLONOSCOPY  05/2014   TUBAL LIGATION      Social History  Tobacco Use   Smoking status: Never   Smokeless tobacco: Never  Vaping Use   Vaping status: Never Used  Substance Use Topics   Alcohol use: Yes    Alcohol/week: 12.0 - 16.0 standard drinks of alcohol    Types: 12 - 16 Cans of beer per week    Comment: per week   Drug use: No     Medication list has been reviewed and updated.  Current Meds  Medication Sig   aspirin EC 81 MG tablet Take 81 mg by mouth daily. Swallow  whole.   busPIRone (BUSPAR) 15 MG tablet Take 1 tablet by mouth twice daily   FLUoxetine (PROZAC) 20 MG capsule Take 1 capsule (20 mg total) by mouth daily.   ibuprofen (ADVIL) 600 MG tablet Take 1 tablet (600 mg total) by mouth every 6 (six) hours as needed.   omeprazole (PRILOSEC OTC) 20 MG tablet Take 20 mg by mouth as needed.       05/01/2023    3:18 PM 07/05/2022    2:25 PM 01/17/2022    2:15 PM 09/14/2021   11:14 AM  GAD 7 : Generalized Anxiety Score  Nervous, Anxious, on Edge 1 0 1 0  Control/stop worrying 1 0 0 0  Worry too much - different things 1 0 1 0  Trouble relaxing 0 0 0 0  Restless 0 0 0 1  Easily annoyed or irritable 2 0 0 0  Afraid - awful might happen 0 0 1 0  Total GAD 7 Score 5 0 3 1  Anxiety Difficulty Not difficult at all Not difficult at all Somewhat difficult Not difficult at all       05/01/2023    3:18 PM 07/05/2022    2:25 PM 01/17/2022    2:15 PM  Depression screen PHQ 2/9  Decreased Interest 0 0 0  Down, Depressed, Hopeless 0 0 0  PHQ - 2 Score 0 0 0  Altered sleeping 1 0 0  Tired, decreased energy 2 0 0  Change in appetite 0 0 0  Feeling bad or failure about yourself  1 0 0  Trouble concentrating 1 0 0  Moving slowly or fidgety/restless 0 0 0  Suicidal thoughts 0 0 0  PHQ-9 Score 5 0 0  Difficult doing work/chores Not difficult at all Not difficult at all Not difficult at all    BP Readings from Last 3 Encounters:  05/01/23 112/78  11/08/22 134/62  07/05/22 120/76    Physical Exam Vitals and nursing note reviewed.  Constitutional:      General: She is not in acute distress.    Appearance: She is not diaphoretic.  HENT:     Head: Normocephalic and atraumatic.     Right Ear: External ear normal.     Left Ear: External ear normal.     Nose: Nose normal.  Eyes:     General:        Right eye: No discharge.        Left eye: No discharge.     Conjunctiva/sclera: Conjunctivae normal.     Pupils: Pupils are equal, round, and reactive  to light.  Neck:     Thyroid: No thyromegaly.     Vascular: No JVD.  Cardiovascular:     Rate and Rhythm: Normal rate and regular rhythm.     Heart sounds: Normal heart sounds. No murmur heard.    No friction rub. No gallop.  Pulmonary:     Effort: Pulmonary  effort is normal.     Breath sounds: Normal breath sounds. No wheezing, rhonchi or rales.  Abdominal:     General: Bowel sounds are normal.     Palpations: Abdomen is soft. There is no mass.     Tenderness: There is no abdominal tenderness. There is no guarding.  Musculoskeletal:        General: Normal range of motion.     Cervical back: Normal range of motion and neck supple.  Lymphadenopathy:     Cervical: No cervical adenopathy.  Skin:    General: Skin is warm and dry.  Neurological:     Mental Status: She is alert.     Deep Tendon Reflexes: Reflexes are normal and symmetric.     Wt Readings from Last 3 Encounters:  05/01/23 187 lb (84.8 kg)  11/08/22 183 lb (83 kg)  07/05/22 183 lb (83 kg)    BP 112/78   Pulse 70   Ht 5\' 7"  (1.702 m)   Wt 187 lb (84.8 kg)   SpO2 95%   BMI 29.29 kg/m   Assessment and Plan: 1. Anxiety and depression (Primary) Chronic.  Uncontrolled.  Stable.  Patient is still having some issues particular with her anxiety that involves irritability and verbally lashing out at coworkers.  She does not feel that the buspirone seems to be helping much so we will decrease the buspirone to 15 mg once a day and then patient may cut back to one half and then eventually to discontinue once.  In the meantime we will maintain fluoxetine at 20 mg and moving that dosing at night and taking a 10 mg additional in the mornings for total of 30 mg.  Will recheck patient in 3 to 4 weeks.  Patient has been given a list of psychiatry locations in the area to begin to call to see if they will accept her insurance and whether she would be able to get an appointment. - busPIRone (BUSPAR) 15 MG tablet; Take 1 tablet (15 mg  total) by mouth 2 (two) times daily.  Dispense: 60 tablet; Refill: 0 - FLUoxetine (PROZAC) 20 MG capsule; Take 1 capsule (20 mg total) by mouth daily.  Dispense: 90 capsule; Refill: 1 - FLUoxetine (PROZAC) 10 MG tablet; To take q day  Dispense: 90 tablet; Refill: 3     Elizabeth Sauer, MD

## 2023-05-01 NOTE — Patient Instructions (Signed)
Below are places you can call and schedule an appt to see for Behavioral Health Services:   Psychiatry Locations: [facility] [place]  [phone] [facility]     [phone] [facility] [phone] [facility] [place] [phone] [facility] [place] [phone] [facility] [place] [phone] [facility]   [phone] Dr. Caryn Section      [phone]- [place]  [phone]    Looking for Counseling Services? [email or web address]- Virtual Counseling BetterHelp- Virtual Counseling [email or web address]

## 2023-05-22 ENCOUNTER — Ambulatory Visit: Payer: Self-pay | Admitting: Family Medicine

## 2023-05-29 ENCOUNTER — Other Ambulatory Visit: Payer: Self-pay

## 2023-05-29 ENCOUNTER — Ambulatory Visit (INDEPENDENT_AMBULATORY_CARE_PROVIDER_SITE_OTHER): Payer: 59 | Admitting: Family Medicine

## 2023-05-29 ENCOUNTER — Encounter: Payer: Self-pay | Admitting: Family Medicine

## 2023-05-29 DIAGNOSIS — F32A Depression, unspecified: Secondary | ICD-10-CM | POA: Diagnosis not present

## 2023-05-29 DIAGNOSIS — F419 Anxiety disorder, unspecified: Secondary | ICD-10-CM | POA: Diagnosis not present

## 2023-05-29 MED ORDER — FLUOXETINE HCL 10 MG PO TABS
ORAL_TABLET | ORAL | 1 refills | Status: DC
Start: 2023-05-29 — End: 2023-11-19

## 2023-05-29 MED ORDER — FLUOXETINE HCL 20 MG PO CAPS: 20.0000 mg | ORAL_CAPSULE | Freq: Every day | ORAL | 1 refills | Status: DC

## 2023-05-29 NOTE — Progress Notes (Signed)
 Date:  05/29/2023   Name:  Kayla Washington   DOB:  25-May-1960   MRN:  [id number]   Chief Complaint: Depression (Patient presents today for a follow up on her depression. She has been taking her medication as directed and has been doing well. )  Depression        This is a chronic problem.  The current episode started more than 1 year ago.   The onset quality is sudden.   The problem occurs intermittently.  The problem has been gradually improving since onset.  Associated symptoms include no decreased concentration, no fatigue, no helplessness, no hopelessness, does not have insomnia, not irritable, no restlessness, no decreased interest, no appetite change, no body aches, no myalgias, no headaches, no indigestion, not sad and no suicidal ideas.  Past treatments include SSRIs - Selective serotonin reuptake inhibitors.  Compliance with treatment is good.  Previous treatment provided moderate relief.   Lab Results  Component Value Date   NA 139 11/08/2022   K 4.5 11/08/2022   CO2 23 11/08/2022   GLUCOSE 96 11/08/2022   BUN 11 11/08/2022   CREATININE 0.69 11/08/2022   CALCIUM 9.6 11/08/2022   EGFR 98 11/08/2022   GFRNONAA >60 06/14/2017   Lab Results  Component Value Date   CHOL 230 (H) 11/08/2022   HDL 59 11/08/2022   LDLCALC 153 (H) 11/08/2022   TRIG 100 11/08/2022   CHOLHDL 3.2 05/17/2016   Lab Results  Component Value Date   TSH 1.843 06/14/2017   No results found for: "HGBA1C" Lab Results  Component Value Date   WBC 6.3 06/14/2017   HGB 13.6 06/14/2017   HCT 41.2 06/14/2017   MCV 83.6 06/14/2017   PLT 345 06/14/2017   Lab Results  Component Value Date   ALT 30 11/08/2022   AST 27 11/08/2022   ALKPHOS 124 (H) 11/08/2022   BILITOT 0.3 11/08/2022   No results found for: "25OHVITD2", "25OHVITD3", "VD25OH"   Review of Systems  Constitutional:  Negative for appetite change, chills, fatigue and fever.  HENT:  Negative for nosebleeds, postnasal drip, rhinorrhea,  sinus pressure, sinus pain, sneezing and sore throat.   Respiratory:  Negative for chest tightness, shortness of breath and wheezing.   Cardiovascular:  Negative for chest pain, palpitations and leg swelling.  Musculoskeletal:  Negative for myalgias.  Neurological:  Negative for headaches.  Psychiatric/Behavioral:  Positive for depression. Negative for decreased concentration and suicidal ideas. The patient does not have insomnia.     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 Arvil Chaco   COLONOSCOPY  05/2014   TUBAL LIGATION      Social History   Tobacco Use   Smoking status: Never   Smokeless tobacco: Never  Vaping Use   Vaping status: Never Used  Substance Use Topics   Alcohol use: Yes    Alcohol/week: 12.0 - 16.0 standard drinks of alcohol    Types: 12 - 16 Cans of beer per week  Comment: per week   Drug use: No     Medication list has been reviewed and updated.  Current Meds  Medication Sig   aspirin EC 81 MG tablet Take 81 mg by mouth daily. Swallow whole.   busPIRone (BUSPAR) 15 MG tablet Take 1 tablet (15 mg total) by mouth 2 (two) times daily.   FLUoxetine (PROZAC) 10 MG tablet To take q day   FLUoxetine (PROZAC) 20 MG capsule Take 1 capsule (20 mg total) by mouth daily.   ibuprofen (ADVIL) 600 MG tablet Take 1 tablet (600 mg total) by mouth every 6 (six) hours as needed.   omeprazole (PRILOSEC OTC) 20 MG tablet Take 20 mg by mouth as needed.       05/29/2023    2:03  PM 05/01/2023    3:18 PM 07/05/2022    2:25 PM 01/17/2022    2:15 PM  GAD 7 : Generalized Anxiety Score  Nervous, Anxious, on Edge 0 1 0 1  Control/stop worrying 0 1 0 0  Worry too much - different things 0 1 0 1  Trouble relaxing 0 0 0 0  Restless 0 0 0 0  Easily annoyed or irritable 0 2 0 0  Afraid - awful might happen 0 0 0 1  Total GAD 7 Score 0 5 0 3  Anxiety Difficulty Not difficult at all Not difficult at all Not difficult at all Somewhat difficult       05/29/2023    2:02 PM 05/01/2023    3:18 PM 07/05/2022    2:25 PM  Depression screen PHQ 2/9  Decreased Interest 0 0 0  Down, Depressed, Hopeless 0 0 0  PHQ - 2 Score 0 0 0  Altered sleeping 0 1 0  Tired, decreased energy 0 2 0  Change in appetite 0 0 0  Feeling bad or failure about yourself  0 1 0  Trouble concentrating 0 1 0  Moving slowly or fidgety/restless 0 0 0  Suicidal thoughts 0 0 0  PHQ-9 Score 0 5 0  Difficult doing work/chores Not difficult at all Not difficult at all Not difficult at all    BP Readings from Last 3 Encounters:  05/29/23 120/74  05/01/23 112/78  11/08/22 134/62    Physical Exam Vitals and nursing note reviewed.  Constitutional:      General: She is not irritable.She is not in acute distress.    Appearance: She is not diaphoretic.  HENT:     Head: Normocephalic and atraumatic.     Right Ear: Tympanic membrane and external ear normal.     Left Ear: Tympanic membrane and external ear normal.     Nose: Nose normal.  Eyes:     General:        Right eye: No discharge.        Left eye: No discharge.     Conjunctiva/sclera: Conjunctivae normal.     Pupils: Pupils are equal, round, and reactive to light.  Neck:     Thyroid: No thyromegaly.     Vascular: No JVD.  Cardiovascular:     Rate and Rhythm: Normal rate and regular rhythm.     Heart sounds: Normal heart sounds. No murmur heard.    No friction rub. No gallop.  Pulmonary:     Effort: Pulmonary effort is normal.     Breath  sounds: Normal breath sounds. No wheezing, rhonchi or rales.  Abdominal:     General: Bowel sounds are normal.  Palpations: Abdomen is soft. There is no mass.     Tenderness: There is no abdominal tenderness. There is no guarding.  Musculoskeletal:        General: Normal range of motion.     Cervical back: Normal range of motion and neck supple.  Lymphadenopathy:     Cervical: No cervical adenopathy.  Skin:    General: Skin is warm and dry.  Neurological:     Mental Status: She is alert.     Deep Tendon Reflexes: Reflexes are normal and symmetric.     Wt Readings from Last 3 Encounters:  05/29/23 186 lb (84.4 kg)  05/01/23 187 lb (84.8 kg)  11/08/22 183 lb (83 kg)    BP 120/74   Pulse 79   Ht 5\' 7"  (1.702 m)   Wt 186 lb (84.4 kg)   SpO2 95%   BMI 29.13 kg/m   Assessment and Plan: 1. Anxiety and depression Chronic.  Controlled.  Stable.  Patient has been on buspirone but is currently wanting to get off of this and has been weaning down to just taking 1 at night.  At this point in time we can discontinue buspirone and that her GAD score is 0 and see how she does only with the 30 mg fluoxetine.  PHQ was 0 GAD score is 0 as well we will continue with current dosing of buspirone 20 mg in the evening and 10 mg in the morning.  Patient is tolerating this very well and would like to continue on this given that her disposition has improved significantly on this regimen.  Both PHQ and GAD were 5 but she was having more irritability at work subjectively this has improved significantly and PHQ and GAD objectively have improved to 0.  We will recheck patient in 6 months or sooner if needed. - FLUoxetine (PROZAC) 10 MG tablet; To take q day  Dispense: 90 tablet; Refill: 1 - FLUoxetine (PROZAC) 20 MG capsule; Take 1 capsule (20 mg total) by mouth daily.  Dispense: 90 capsule; Refill: 1     Elizabeth Sauer, MD

## 2023-08-14 ENCOUNTER — Ambulatory Visit: Admitting: Family Medicine

## 2023-08-14 ENCOUNTER — Encounter: Payer: Self-pay | Admitting: Family Medicine

## 2023-08-14 ENCOUNTER — Ambulatory Visit: Payer: Self-pay

## 2023-08-14 VITALS — BP 130/80 | HR 73 | Ht 67.0 in | Wt 186.1 lb

## 2023-08-14 DIAGNOSIS — K2100 Gastro-esophageal reflux disease with esophagitis, without bleeding: Secondary | ICD-10-CM

## 2023-08-14 DIAGNOSIS — F32A Depression, unspecified: Secondary | ICD-10-CM | POA: Diagnosis not present

## 2023-08-14 DIAGNOSIS — F419 Anxiety disorder, unspecified: Secondary | ICD-10-CM

## 2023-08-14 DIAGNOSIS — R030 Elevated blood-pressure reading, without diagnosis of hypertension: Secondary | ICD-10-CM

## 2023-08-14 MED ORDER — PANTOPRAZOLE SODIUM 40 MG PO TBEC
40.0000 mg | DELAYED_RELEASE_TABLET | Freq: Every day | ORAL | 3 refills | Status: DC
Start: 2023-08-14 — End: 2024-02-13

## 2023-08-14 NOTE — Telephone Encounter (Signed)
  Chief Complaint: hypertension Symptoms: sweating, systolic 150's, general not feeling well Frequency: symptoms began one week ago Pertinent Negatives: Patient denies chest pain Disposition: [] ED /[] Urgent Care (no appt availability in office) / [] Appointment(In office/virtual)/ []  [facility]/ [] Home Care/ [] Refused Recommended Disposition /[] [facility]/ [x]  Follow-up with PCP Additional Notes: patient feeling generally un well. Concerned for elevated blood pressure  Reason for Disposition  Systolic BP  >= 160 OR Diastolic >= 100  Answer Assessment - Initial Assessment Questions 1. BLOOD PRESSURE: "What is the blood pressure?" "Did you take at least two measurements 5 minutes apart?"     154/75 last night 12 hours ago 2. ONSET: "When did you take your blood pressure?"     Last taken last night, symptoms started last Wednesday 3. HOW: "How did you take your blood pressure?" (e.g., automatic home BP monitor, visiting nurse)     Automatic cuff at home 4. HISTORY: "Do you have a history of high blood pressure?"     Never diagnosed 5. MEDICINES: "Are you taking any medicines for blood pressure?" "Have you missed any doses recently?"     none 6. OTHER SYMPTOMS: "Do you have any symptoms?" (e.g., blurred vision, chest pain, difficulty breathing, headache, weakness)     Similar to hot flash, sweating, nausea, morning headaches  Protocols used: Blood Pressure - High-A-AH

## 2023-08-14 NOTE — Patient Instructions (Signed)

## 2023-08-14 NOTE — Progress Notes (Signed)
 Date:  08/14/2023   Name:  Kayla Washington   DOB:  12/26/1960   MRN:  [id number]   Chief Complaint: Blood Pressure Check (Patient said her bp has been elvated at home, has been checking it at home with her machine, 168/74, started last Tuesday), Fatigue (Patient said she has been feeling really tired, has no energy to do anything ), and Gastroesophageal Reflux (Patient said she has had a lot of heartburn and she threw up last night at 12 am )  Gastroesophageal Reflux She complains of heartburn. She reports no abdominal pain, no belching, no chest pain, no dysphagia, no nausea, no sore throat, no stridor or no wheezing. This is a recurrent problem. The current episode started 1 to 4 weeks ago (2 weeks). The problem occurs frequently. The problem has been waxing and waning. The heartburn duration is several minutes. Heartburn location: midesophageal. The heartburn is of moderate intensity. The heartburn wakes her from sleep. The symptoms are aggravated by certain foods and lying down. Pertinent negatives include no melena. Acid/"burning sensation" back of throat. She has tried a histamine-2 antagonist and a PPI for the symptoms. The treatment provided mild relief.  Hypertension This is a chronic problem. The current episode started more than 1 year ago. The problem has been gradually improving since onset. The problem is controlled. Pertinent negatives include no anxiety, blurred vision, chest pain, headaches, malaise/fatigue, neck pain, orthopnea, palpitations, peripheral edema, PND, shortness of breath or sweats. There are no associated agents to hypertension. Past treatments include nothing. The current treatment provides moderate improvement. There are no compliance problems.  There is no history of chronic renal disease, a hypertension causing med or renovascular disease.    Lab Results  Component Value Date   NA 139 11/08/2022   K 4.5 11/08/2022   CO2 23 11/08/2022   GLUCOSE 96 11/08/2022    BUN 11 11/08/2022   CREATININE 0.69 11/08/2022   CALCIUM 9.6 11/08/2022   EGFR 98 11/08/2022   GFRNONAA >60 06/14/2017   Lab Results  Component Value Date   CHOL 230 (H) 11/08/2022   HDL 59 11/08/2022   LDLCALC 153 (H) 11/08/2022   TRIG 100 11/08/2022   CHOLHDL 3.2 05/17/2016   Lab Results  Component Value Date   TSH 1.843 06/14/2017   No results found for: "HGBA1C" Lab Results  Component Value Date   WBC 6.3 06/14/2017   HGB 13.6 06/14/2017   HCT 41.2 06/14/2017   MCV 83.6 06/14/2017   PLT 345 06/14/2017   Lab Results  Component Value Date   ALT 30 11/08/2022   AST 27 11/08/2022   ALKPHOS 124 (H) 11/08/2022   BILITOT 0.3 11/08/2022   No results found for: "25OHVITD2", "25OHVITD3", "VD25OH"   Review of Systems  Constitutional:  Negative for malaise/fatigue.  HENT:  Negative for sore throat and trouble swallowing.   Eyes:  Negative for blurred vision.  Respiratory:  Negative for shortness of breath and wheezing.   Cardiovascular:  Negative for chest pain, palpitations, orthopnea and PND.  Gastrointestinal:  Positive for heartburn. Negative for abdominal pain, blood in stool, constipation, diarrhea, dysphagia, melena and nausea.  Musculoskeletal:  Negative for neck pain.  Neurological:  Negative for headaches.    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 Grier Leber   COLONOSCOPY  05/2014   TUBAL LIGATION      Social History   Tobacco Use   Smoking status: Never    Smokeless tobacco: Never  Vaping Use   Vaping status: Never Used  Substance Use Topics   Alcohol use: Yes    Alcohol/week: 12.0 - 16.0 standard drinks of alcohol    Types: 12 - 16 Cans of beer per week    Comment: per week   Drug use: No     Medication list has been reviewed and updated.  Current Meds  Medication Sig   aspirin EC 81 MG tablet Take 81 mg by mouth daily. Swallow whole.   cimetidine (TAGAMET) 200 MG tablet Take 200 mg by mouth 2 (two) times daily.   FLUoxetine  (PROZAC ) 10 MG tablet To take q day   FLUoxetine  (PROZAC ) 20 MG capsule Take 1 capsule (20 mg total) by mouth daily.   ibuprofen  (ADVIL ) 600 MG tablet Take 1 tablet (600 mg total) by mouth every 6 (six) hours as needed.   omeprazole (PRILOSEC OTC) 20 MG tablet Take 20 mg by mouth as needed.       08/14/2023    2:44 PM 05/29/2023    2:03 PM 05/01/2023    3:18 PM 07/05/2022    2:25 PM  GAD 7 : Generalized Anxiety Score  Nervous, Anxious, on Edge 2 0 1 0  Control/stop worrying 1 0 1 0  Worry too much - different things 1 0 1 0  Trouble relaxing 1 0 0 0  Restless 1 0 0 0  Easily annoyed or irritable 2 0 2 0  Afraid - awful might happen 1 0 0 0  Total GAD 7 Score 9 0 5 0  Anxiety Difficulty Somewhat difficult Not difficult at all Not difficult at all Not difficult at all       08/14/2023    2:43 PM 05/29/2023    2:02 PM 05/01/2023    3:18 PM  Depression screen PHQ 2/9  Decreased Interest 3 0 0  Down, Depressed, Hopeless 0 0 0  PHQ - 2 Score 3 0 0  Altered sleeping 2 0 1  Tired, decreased energy 3 0 2  Change in appetite 2 0 0  Feeling bad or failure about yourself  1 0 1  Trouble concentrating 2 0 1  Moving slowly or fidgety/restless 0 0 0  Suicidal thoughts 0 0 0  PHQ-9 Score 13 0 5  Difficult doing work/chores Somewhat difficult Not difficult at all Not difficult at all    BP Readings from Last 3 Encounters:  08/14/23 130/80  05/29/23 120/74  05/01/23 112/78    Physical Exam Vitals and  nursing note reviewed.  Constitutional:      General: She is not in acute distress.    Appearance: She is not diaphoretic.  HENT:     Head: Normocephalic and atraumatic.     Right Ear: External ear normal.     Left Ear: External ear normal.     Nose: Nose normal.  Eyes:     General:        Right eye: No discharge.  Left eye: No discharge.     Conjunctiva/sclera: Conjunctivae normal.     Pupils: Pupils are equal, round, and reactive to light.  Neck:     Thyroid : No thyromegaly.     Vascular: No JVD.  Cardiovascular:     Rate and Rhythm: Normal rate and regular rhythm.     Heart sounds: Normal heart sounds. No murmur heard.    No friction rub. No gallop.  Pulmonary:     Effort: Pulmonary effort is normal.     Breath sounds: Normal breath sounds.  Abdominal:     General: Bowel sounds are normal.     Palpations: Abdomen is soft. There is no mass.     Tenderness: There is no abdominal tenderness. There is no guarding.  Musculoskeletal:        General: Normal range of motion.     Cervical back: Normal range of motion and neck supple.  Lymphadenopathy:     Cervical: No cervical adenopathy.  Skin:    General: Skin is warm and dry.  Neurological:     Mental Status: She is alert.     Wt Readings from Last 3 Encounters:  08/14/23 186 lb 2 oz (84.4 kg)  05/29/23 186 lb (84.4 kg)  05/01/23 187 lb (84.8 kg)    BP 130/80   Pulse 73   Ht 5\' 7"  (1.702 m)   Wt 186 lb 2 oz (84.4 kg)   SpO2 95%   BMI 29.15 kg/m   Assessment and Plan:  1. Elevated blood pressure reading (Primary) New onset.  Episodic.  Waxes and wanes in intensity.  Patient has been having symptomatology that she thought may be due to to making her "feel bad "and has associated this perhaps with blood pressure elevation.  I reassured her that this is unlikely a presentation of hypertension and upon rechecking of the blood pressure today by  auscultation method her blood pressure is 130/80.  I have placed  patient on a DASH diet and that she needs to lose some weight as well and would like for her to initiate this and return and we will recheck her blood pressure at that time.  In the meantime I have also given her information on properly positioning for blood pressure checks and patient will follow these guidelines when she checks her blood pressures and these readings with her at her next visit when she returns in 6 weeks for evaluation  2. Gastroesophageal reflux disease with esophagitis without hemorrhage New onset.  Episodic.  Usually occurs at night but has had some daytime breakthrough.  Not associated with hematemesis, melena nor dysphagia.  Described as an acid sensation in the "back of the throat "and it can be positional when she lies on her back and side.  Patient has been placed on pantoprazole 40 mg once a day and we will recheck for symptomatic relief in 6 weeks  3. Anxiety and depression Chronic.  Controlled.  Stable.  Patient recently on buspirone  which was asked to be discontinued by patient.  In the meantime anxiety has increased.  Patient is taking Prozac  and is doing well at current dosing for depression but her anxiety is unresolving and is actually worsened since discontinuance she still does not want to resume her buspirone  and we will continue the Prozac  at current dosing of 30 mg a day and we will recheck in 6 weeks to see if we may need to revisit the possibility of resuming buspirone .   Alayne Allis, MD

## 2023-08-14 NOTE — Telephone Encounter (Signed)
 Noted  Pt has a appt.  KP

## 2023-09-11 ENCOUNTER — Telehealth: Payer: Self-pay

## 2023-09-11 NOTE — Telephone Encounter (Signed)
  Called patient to schedule appointment.  As she has not been seen here in the last 6 months, she is not eligible for a nurse only appointment to assess UTI symptoms.  She was given an appointment on July 27th at 3:55pm with Anice Kerbs CNM to address vaginal concerns.  I encouraged her to reach out to her PCP or urgent care regarding her UTI symptoms to see if she can be seen sooner.

## 2023-09-22 ENCOUNTER — Telehealth: Payer: Self-pay

## 2023-09-22 NOTE — Telephone Encounter (Signed)
 Patient has appointment 09/26/23 with Kayla Washington. She is inquiring if it's covered by her insurance [employer]. Advised we accept her insurance, but every plan is different. She will need to contact her insurance to inquire about coverage for the visit.

## 2023-09-26 ENCOUNTER — Encounter: Payer: Self-pay | Admitting: Licensed Practical Nurse

## 2023-09-26 ENCOUNTER — Other Ambulatory Visit (HOSPITAL_COMMUNITY)
Admission: RE | Admit: 2023-09-26 | Discharge: 2023-09-26 | Disposition: A | Discharge status: Home / Self Care | Source: Ambulatory Visit | Attending: Licensed Practical Nurse | Admitting: Licensed Practical Nurse

## 2023-09-26 ENCOUNTER — Ambulatory Visit (INDEPENDENT_AMBULATORY_CARE_PROVIDER_SITE_OTHER): Admitting: Licensed Practical Nurse

## 2023-09-26 VITALS — BP 128/80 | HR 87 | Ht 67.0 in | Wt 185.2 lb

## 2023-09-26 DIAGNOSIS — Z01419 Encounter for gynecological examination (general) (routine) without abnormal findings: Secondary | ICD-10-CM | POA: Diagnosis present

## 2023-09-26 DIAGNOSIS — Z1211 Encounter for screening for malignant neoplasm of colon: Secondary | ICD-10-CM

## 2023-09-26 DIAGNOSIS — R5383 Other fatigue: Secondary | ICD-10-CM

## 2023-09-26 DIAGNOSIS — Z1239 Encounter for other screening for malignant neoplasm of breast: Secondary | ICD-10-CM

## 2023-09-26 DIAGNOSIS — Z124 Encounter for screening for malignant neoplasm of cervix: Secondary | ICD-10-CM | POA: Diagnosis present

## 2023-09-26 DIAGNOSIS — M25511 Pain in right shoulder: Secondary | ICD-10-CM | POA: Diagnosis not present

## 2023-09-26 DIAGNOSIS — Z1272 Encounter for screening for malignant neoplasm of vagina: Secondary | ICD-10-CM | POA: Diagnosis not present

## 2023-09-26 DIAGNOSIS — Z13220 Encounter for screening for lipoid disorders: Secondary | ICD-10-CM

## 2023-09-26 NOTE — Progress Notes (Addendum)
 Joshua Cathryne BROCKS, MD (Inactive)   No chief complaint on file.   HPI:      Kayla Washington is a 63 y.o. G2P2002 whose LMP was No LMP recorded. Patient has had a hysterectomy., presents today for annual exam.  Reports some vaginal burning in the perineum area. Has resolved with witch hazel and warm bath.  - Reports some incontinence when sneezing, coughing. Not overly concerned about it that this time.  - PCP in [place], Dr. Raynold, saw them in may 2025. - Dentist 4 months ago. - Eye exam 1 year ago, wears glasses.  - No new medical conditions - Hysterectomy with oopherectomy of 1 ovary. No cervix. Done in 2003. - Reports anxiety and agitation. Has occasional panic attacks that scare me. Is taking Prozac  managed by her PCP, says its not helping. Recommended she follow up with PCP for dose adjustment. Discussed adding a chamomile and passion flower tea for stress. - Last mammogram was 3 years ago. Ordered one today.  - Can't recall last colonscopy but thinks its been over 10 years. Ordered today.  - Not sexually active in 4 years. Declined STI testing.  - Denies bowel issues.  - Denies smoking cigarrette, vaping or tobacco use.  - denies drug use - reports being a heavy drinker. Says she stopped 4 months ago after getting scared because it was making me sick, I would throw up and my throat would burn. Now drinks 3 to 4 cocktails or beer a week.  -Denies hx of breast or ovarian cancer in family  - Has some trouble staying asleep but reports she feels she is getting enough sleep.  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 Fleeta Milks   COLONOSCOPY  05/2014   TUBAL LIGATION      Family History  Problem Relation Age of Onset   Stroke Mother    Diabetes Mother    Colon polyps Mother    Hypertension Father    Heart disease Father    Heart attack Father    Throat cancer Father    Healthy Brother    Hypertension Other    Hyperlipidemia Other    Heart disease Other    Diabetes Other    Breast cancer Neg Hx     Social History   Socioeconomic History   Marital status: Divorced    Spouse name: Not on file   Number of children: 2   Years of education: H/S   Highest education level: High school graduate  Occupational History   Occupation: Tree surgeon at [employer]    Comment: Full-Time  Tobacco Use   Smoking status: Never   Smokeless tobacco: Never  Vaping Use   Vaping status: Never Used  Substance and Sexual Activity   Alcohol use: Yes    Alcohol/week: 12.0 - 16.0 standard drinks of alcohol    Types: 12 - 16 Cans of beer per week    Comment: per week   Drug  use: No   Sexual activity: Not Currently    Birth control/protection: Surgical    Comment: Hysterectomy  Other Topics Concern   Not on file  Social History Narrative   Not on file   Social Drivers of Health   Financial Resource Strain: Low Risk  (06/09/2017)   Overall Financial Resource Strain (CARDIA)    Difficulty of Paying Living Expenses: Not hard at all  Food Insecurity: No Food Insecurity (05/01/2023)   Hunger Vital Sign    Worried About Running Out of Food in the Last Year: Never true    Ran Out of Food in the Last Year: Never true  Transportation Needs: No Transportation Needs (05/01/2023)   PRAPARE - Administrator, Civil Service (Medical): No    Lack of Transportation (Non-Medical): No  Physical Activity: Inactive (06/09/2017)   Exercise Vital Sign    Days of Exercise per  Week: 0 days    Minutes of Exercise per Session: 0 min  Stress: Not on file  Social Connections: Not on file  Intimate Partner Violence: Not At Risk (05/01/2023)   Humiliation, Afraid, Rape, and Kick questionnaire    Fear of Current or Ex-Partner: No    Emotionally Abused: No    Physically Abused: No    Sexually Abused: No    Outpatient Medications Prior to Visit  Medication Sig Dispense Refill   aspirin EC 81 MG tablet Take 81 mg by mouth daily. Swallow whole.     cimetidine (TAGAMET) 200 MG tablet Take 200 mg by mouth 2 (two) times daily.     FLUoxetine  (PROZAC ) 10 MG tablet To take q day 90 tablet 1   FLUoxetine  (PROZAC ) 20 MG capsule Take 1 capsule (20 mg total) by mouth daily. 90 capsule 1   ibuprofen  (ADVIL ) 600 MG tablet Take 1 tablet (600 mg total) by mouth every 6 (six) hours as needed. 60 tablet 0   omeprazole (PRILOSEC OTC) 20 MG tablet Take 20 mg by mouth as needed.     pantoprazole  (PROTONIX ) 40 MG tablet Take 1 tablet (40 mg total) by mouth daily. 30 tablet 3   No facility-administered medications prior to visit.      ROS:  Review of Systems  Constitutional:  Positive for fatigue.  HENT:         Bump on my head since Monday.   Eyes: Negative.   Respiratory: Negative.    Cardiovascular: Negative.   Gastrointestinal: Negative.   Endocrine: Negative.   Genitourinary: Negative.   Musculoskeletal:        Right upper arm pain with backward motion   Allergic/Immunologic: Negative.   Neurological: Negative.   Hematological: Negative.   Psychiatric/Behavioral:  Positive for agitation. The patient is nervous/anxious.      OBJECTIVE:   Vitals:  BP 128/80 (BP Location: Left Arm, Patient Position: Sitting, Cuff Size: Large)   Pulse 87   Ht 5' 7 (1.702 m)   Wt 84 kg   BMI 29.01 kg/m   Physical Exam Constitutional:      Appearance: Normal appearance.  HENT:     Head: Normocephalic.     Nose: Nose normal.   Eyes:     Pupils: Pupils are equal, round,  and reactive to light.    Cardiovascular:     Rate and Rhythm: Normal rate and regular rhythm.  Pulmonary:     Effort: Pulmonary effort is normal.     Breath sounds: Normal breath sounds.  Chest:  Breasts:  Right: Normal.     Left: Normal.     Comments: Small skin tag on underneath right breast.  Abdominal:     General: Abdomen is flat.     Palpations: Abdomen is soft.  Genitourinary:    General: Normal vulva.     Rectum: Normal.     Comments: 1 inch cyst like on inside of right labia minora.  Bimanual exam: No masses, non tender.   Musculoskeletal:        General: Normal range of motion.     Cervical back: Normal range of motion.   Skin:    General: Skin is warm and dry.   Neurological:     Mental Status: She is alert and oriented to person, place, and time.   Psychiatric:        Mood and Affect: Mood normal.        Behavior: Behavior normal.     Comments: Behavior :scattered     Results: No results found for this or any previous visit (from the past 24 hours).   Assessment/Plan: Well woman exam - Plan: Cytology - PAP, MM DIGITAL SCREENING BILATERAL, Lipid panel, CBC w/Diff/Platelet, Comprehensive metabolic panel with GFR, TSH + free T4, VITAMIN D 25 Hydroxy (Vit-D Deficiency, Fractures), CANCELED: TSH + free T4, CANCELED: CBC w/Diff/Platelet, CANCELED: Comprehensive metabolic panel with GFR, CANCELED: VITAMIN D 25 Hydroxy (Vit-D Deficiency, Fractures)  Right shoulder pain, unspecified chronicity - Plan: Ambulatory referral to Physical Therapy  Fatigue, unspecified type - Plan: CBC w/Diff/Platelet, TSH + free T4, VITAMIN D 25 Hydroxy (Vit-D Deficiency, Fractures), CANCELED: TSH + free T4, CANCELED: CBC w/Diff/Platelet, CANCELED: VITAMIN D 25 Hydroxy (Vit-D Deficiency, Fractures)  Cervical cancer screening - Plan: Cytology - PAP  Encounter for breast cancer screening other than mammogram - Plan: MM DIGITAL SCREENING BILATERAL  Colon cancer screening - Plan:  Ambulatory referral to Gastroenterology  Screening for cholesterol level - Plan: Lipid panel  Discussed yearly pap smears for hx of abnormal LSIL and HPV +.  Discussed scheduling a follow up with Dr. Starla concerning cyst found on labia.  Will return on another for labs   No orders of the defined types were placed in this encounter.    Marybelle Grayer, Student-MidWife 09/26/2023 5:18 PM

## 2023-09-29 ENCOUNTER — Telehealth: Payer: Self-pay

## 2023-09-29 ENCOUNTER — Other Ambulatory Visit: Payer: Self-pay

## 2023-09-29 ENCOUNTER — Other Ambulatory Visit

## 2023-09-29 DIAGNOSIS — Z1211 Encounter for screening for malignant neoplasm of colon: Secondary | ICD-10-CM

## 2023-09-29 DIAGNOSIS — R5383 Other fatigue: Secondary | ICD-10-CM

## 2023-09-29 DIAGNOSIS — Z01419 Encounter for gynecological examination (general) (routine) without abnormal findings: Secondary | ICD-10-CM

## 2023-09-29 DIAGNOSIS — Z13220 Encounter for screening for lipoid disorders: Secondary | ICD-10-CM

## 2023-09-29 MED ORDER — NA SULFATE-K SULFATE-MG SULF 17.5-3.13-1.6 GM/177ML PO SOLN: 1.0000 | Freq: Once | ORAL | 0 refills | Status: AC

## 2023-09-29 NOTE — Telephone Encounter (Signed)
 Gastroenterology Pre-Procedure Review  Request Date: 11/05/23 Requesting Physician: Dr. Marinda  PATIENT REVIEW QUESTIONS: The patient responded to the following health history questions as indicated:    1. Are you having any GI issues? no 2. Do you have a personal history of Polyps? no 3. Do you have a family history of Colon Cancer or Polyps? yes (mother colon polyps) 4. Diabetes Mellitus? no 5. Joint replacements in the past 12 months?no 6. Major health problems in the past 3 months?no 7. Any artificial heart valves, MVP, or defibrillator?no    MEDICATIONS & ALLERGIES:    Patient reports the following regarding taking any anticoagulation/antiplatelet therapy:   Plavix, Coumadin, Eliquis, Xarelto, Lovenox, Pradaxa, Brilinta, or Effient? no Aspirin? yes (81 mg daily)  Patient confirms/reports the following medications:  Current Outpatient Medications  Medication Sig Dispense Refill   aspirin EC 81 MG tablet Take 81 mg by mouth daily. Swallow whole.     cimetidine (TAGAMET) 200 MG tablet Take 200 mg by mouth 2 (two) times daily.     FLUoxetine  (PROZAC ) 10 MG tablet To take q day 90 tablet 1   FLUoxetine  (PROZAC ) 20 MG capsule Take 1 capsule (20 mg total) by mouth daily. 90 capsule 1   ibuprofen  (ADVIL ) 600 MG tablet Take 1 tablet (600 mg total) by mouth every 6 (six) hours as needed. 60 tablet 0   omeprazole (PRILOSEC OTC) 20 MG tablet Take 20 mg by mouth as needed.     pantoprazole  (PROTONIX ) 40 MG tablet Take 1 tablet (40 mg total) by mouth daily. 30 tablet 3   No current facility-administered medications for this visit.    Patient confirms/reports the following allergies:  No Known Allergies  No orders of the defined types were placed in this encounter.   AUTHORIZATION INFORMATION Primary Insurance: 1D#: Group #:  Secondary Insurance: 1D#: Group #:  SCHEDULE INFORMATION: Date: 11/05/23 Time: Location: [facility]

## 2023-09-29 NOTE — Telephone Encounter (Signed)
 The patient called in to schedule her colonoscopy.

## 2023-09-30 ENCOUNTER — Telehealth: Payer: Self-pay

## 2023-09-30 LAB — CBC WITH DIFFERENTIAL/PLATELET
Basophils Absolute: 0.1 10*3/uL (ref 0.0–0.2)
Basos: 1 %
EOS (ABSOLUTE): 0.3 10*3/uL (ref 0.0–0.4)
Eos: 5 %
Hematocrit: 38.5 % (ref 34.0–46.6)
Hemoglobin: 12.3 g/dL (ref 11.1–15.9)
Immature Grans (Abs): 0 10*3/uL (ref 0.0–0.1)
Immature Granulocytes: 0 %
Lymphocytes Absolute: 1.8 10*3/uL (ref 0.7–3.1)
Lymphs: 28 %
MCH: 27 pg (ref 26.6–33.0)
MCHC: 31.9 g/dL (ref 31.5–35.7)
MCV: 84 fL (ref 79–97)
Monocytes Absolute: 0.6 10*3/uL (ref 0.1–0.9)
Monocytes: 9 %
Neutrophils Absolute: 3.8 10*3/uL (ref 1.4–7.0)
Neutrophils: 57 %
Platelets: 332 10*3/uL (ref 150–450)
RBC: 4.56 x10E6/uL (ref 3.77–5.28)
RDW: 14.9 % (ref 11.7–15.4)
WBC: 6.6 10*3/uL (ref 3.4–10.8)

## 2023-09-30 LAB — COMPREHENSIVE METABOLIC PANEL WITH GFR
ALT: 40 IU/L — ABNORMAL HIGH (ref 0–32)
AST: 31 IU/L (ref 0–40)
Albumin: 4.3 g/dL (ref 3.9–4.9)
Alkaline Phosphatase: 135 IU/L — ABNORMAL HIGH (ref 44–121)
BUN/Creatinine Ratio: 19 (ref 12–28)
BUN: 13 mg/dL (ref 8–27)
Bilirubin Total: 0.3 mg/dL (ref 0.0–1.2)
CO2: 23 mmol/L (ref 20–29)
Calcium: 10 mg/dL (ref 8.7–10.3)
Chloride: 102 mmol/L (ref 96–106)
Creatinine, Ser: 0.7 mg/dL (ref 0.57–1.00)
Globulin, Total: 2.5 g/dL (ref 1.5–4.5)
Glucose: 103 mg/dL — ABNORMAL HIGH (ref 70–99)
Potassium: 5 mmol/L (ref 3.5–5.2)
Sodium: 140 mmol/L (ref 134–144)
Total Protein: 6.8 g/dL (ref 6.0–8.5)
eGFR: 97 mL/min/{1.73_m2} (ref >59)

## 2023-09-30 LAB — LIPID PANEL
Chol/HDL Ratio: 4.1 ratio (ref 0.0–4.4)
Cholesterol, Total: 234 mg/dL — ABNORMAL HIGH (ref 100–199)
HDL: 57 mg/dL (ref >39)
LDL Chol Calc (NIH): 160 mg/dL — ABNORMAL HIGH (ref 0–99)
Triglycerides: 94 mg/dL (ref 0–149)
VLDL Cholesterol Cal: 17 mg/dL (ref 5–40)

## 2023-09-30 LAB — VITAMIN D 25 HYDROXY (VIT D DEFICIENCY, FRACTURES): Vit D, 25-Hydroxy: 27.3 ng/mL — ABNORMAL LOW (ref 30.0–100.0)

## 2023-09-30 LAB — TSH+FREE T4
Free T4: 0.93 ng/dL (ref 0.82–1.77)
TSH: 2.88 u[IU]/mL (ref 0.450–4.500)

## 2023-09-30 NOTE — Telephone Encounter (Signed)
 Patient states she has an appointment 10/28/23 to have labial cyst removed. She has since scheduled a colonoscopy for 11/05/23. She's inquiring if this is too soon after the cyst removal and if she needs to reschedule her appointment with us  til after her colonoscopy. Advised this should be fine and she should be fairly healed, but she can r/s if she likes. Patient request Kayla Washington opinion since Dr. Starla will not be back in office til 10/27/23.

## 2023-10-07 LAB — CYTOLOGY - PAP
Adequacy: ABSENT
Comment: NEGATIVE
Comment: NEGATIVE
Comment: NEGATIVE
Diagnosis: UNDETERMINED — AB
HPV 16: NEGATIVE
HPV 18 / 45: NEGATIVE
High risk HPV: POSITIVE — AB

## 2023-10-08 ENCOUNTER — Telehealth: Payer: Self-pay | Admitting: Licensed Practical Nurse

## 2023-10-08 NOTE — Telephone Encounter (Signed)
 Note TC to kim Lab results reviewed PAP ASC-US  HPV pos, previous test ASC-US  HPV neg, had colpo in 2019 LSIL, rec colpo  CMP: one of you liver enzymes is elevated, pt has not been using Tylenol, does drink alcohol but has cut down significantly d/t concerns for her health, will try to decrease intake even more.  Vitamin D  is lower than 30, pt has been tryign to increase her intake of Vit D and gets some sun.  Pt has questions regarding timing of her colonoscopy and removal of a labial cyst, reassured that she is ok to proceed with the cyst removal and then have the colonoscopy. Pt would prefer to have the colposcopy addressed first.  Pt transferred to front desk to schedule coloscopy.  Jinnie Cookey, CNM   [facility]    9:38 AM

## 2023-10-08 NOTE — Telephone Encounter (Signed)
 TC to kim Lab results reviewed PAP ASC-US  HPV pos, previous test ASC-US  HPV neg, had colpo in 2019 LSIL, rec colpo  CMP: one of you liver enzymes is elevated, pt has not been using Tylenol, does drink alcohol but has cut down significantly d/t concerns for her health, will try to decrease intake even more.  Vitamin D  is lower than 30, pt has been tryign to increase her intake of Vit D and gets some sun.  Pt has questions regarding timing of her colonoscopy and removal of a labial cyst, reassured that she is ok to proceed with the cyst removal and then have the colonoscopy. Pt would prefer to have the colposcopy addressed first.  Pt transferred to front desk to schedule coloscopy.  Jinnie Cookey, CNM   [facility]   9:38 AM

## 2023-10-16 ENCOUNTER — Encounter: Payer: Self-pay | Admitting: Physical Therapy

## 2023-10-16 ENCOUNTER — Ambulatory Visit: Attending: Licensed Practical Nurse | Admitting: Physical Therapy

## 2023-10-16 DIAGNOSIS — M25511 Pain in right shoulder: Secondary | ICD-10-CM | POA: Insufficient documentation

## 2023-10-16 NOTE — Therapy (Unsigned)
 OUTPATIENT PHYSICAL THERAPY UPPER EXTREMITY EVALUATION   Patient Name: Kayla Washington MRN: [id number] DOB:07-10-1960, 63 y.o., female Today's Date: 10/16/2023  END OF SESSION:  PT End of Session - 10/16/23 0923     Visit Number 1    Number of Visits 24    Date for PT Re-Evaluation 01/08/24    PT Start Time 0930    PT Stop Time 1015    PT Time Calculation (min) 45 min    Activity Tolerance Patient tolerated treatment well    Behavior During Therapy [facility] for tasks assessed/performed          Past Medical History:  Diagnosis Date   Anxiety    Depression    GERD (gastroesophageal reflux disease)    Past Surgical History:  Procedure Laterality Date   ABDOMINAL HYSTERECTOMY  05/2011   Still has left ovary;  Dr. 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   Adaptation reaction 04/18/2008   Alcohol drinker 04/18/2006   Menopausal and perimenopausal disorder 04/18/2006    PCP: Joshua Cathryne BROCKS, MD   REFERRING PROVIDER:    Delinda Jinnie Jansky, CNM    REFERRING DIAG:  Diagnosis  M25.511 (ICD-10-CM) - Right shoulder pain, unspecified chronicity    THERAPY DIAG:  No diagnosis found.  Rationale for Evaluation and Treatment: Rehabilitation  ONSET DATE: >1 month  SUBJECTIVE:                                                                                                                                                                                      SUBJECTIVE STATEMENT: Pt reports that she is having pain with IR with reach to pants, and to clasp Bra. States that Pain may have started in end of May to beginning June. Pain is the worst at night.   Hand dominance:  Left  PERTINENT HISTORY: No   PAIN:  Are you having pain? Yes: NPRS scale: pt reports that pain is 8-10 with posterior reaching  Pain location: R shoulder  Pain description: tight feeling with sharp pain with movement  Aggravating factors: sleep  Relieving factors: pain management. Ice. Feels better with movement throughout the day. Pain patch  PRECAUTIONS: None  RED FLAGS: None   WEIGHT BEARING RESTRICTIONS: No  FALLS:  Has patient fallen in last 6 months? Yes. 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   PLOF: Independent, Independent with basic ADLs, and Independent with gait  PATIENT GOALS: Get out of pain. Work and perform [employer] tasks without pain get a full night sleep.   NEXT MD VISIT: none at this time.   OBJECTIVE:  Note: Objective measures were completed at Evaluation unless otherwise noted.  DIAGNOSTIC FINDINGS:  None relevant     PATIENT SURVEYS :  Quick Dash:  QUICK DASH  Please rate your ability do the following activities in the last week by selecting the number below the appropriate response.  Quick Dash Disability/Symptom Score: [(sum of 45/ responses/11 (n)] x 25 = 70.4  Minimally Clinically Important Difference (MCID): 15-20 points  (Franchignoni, F. et al. (2013). Minimally clinically important difference of the disabilities of the arm, shoulder, and hand outcome measures (DASH) and its shortened version (Quick DASH). Journal of Orthopaedic & Sports Physical Therapy, 44(1), 30-39)   COGNITION: Overall cognitive status: Within functional limits for tasks assessed and disorganized and verbose      SENSATION: WFL  POSTURE: Rounded shoulders decreased lumbar lordosis   UPPER  EXTREMITY ROM:   Active ROM Right eval Left eval  Shoulder flexion WFL*   Shoulder extension    Shoulder abduction WFL*   Shoulder adduction WFL   Shoulder internal rotation [facility]   Shoulder external rotation 55 deg in supine 90 deg   Elbow flexion [facility] WFL  Elbow extension WFL WFL  (Blank rows = not tested)  UPPER EXTREMITY MMT:  MMT Right eval Left eval  Shoulder flexion 4 4+  Shoulder extension 4+ 4+  Shoulder abduction 4+ * 4+  Shoulder adduction 4+ 4+  Shoulder internal rotation 4 * 4  Shoulder external rotation 4- * 4  Middle trapezius    Lower trapezius    Elbow flexion 4+ 4+  Elbow extension 4+ 4+  (Blank rows = not tested)  *= pain   SHOULDER SPECIAL TESTS: Impingement tests: Neer impingement test: positive , Hawkins/Kennedy impingement test: positive , and Painful arc test: positive  SLAP lesions: Crank test: negative and Biceps load test: negative Instability tests: Load and shift test: negative and Apprehension test: negative Rotator cuff assessment: Drop arm test: negative, Empty can test: positive , and Hornblower's sign: positive  Biceps assessment: Yergason's test: negative  JOINT MOBILITY TESTING:  Grossly WFL.  Mildly reduced scapular mobility with flexion and abduction.   PALPATION:  Multiple TP noted in UT, supraspinatus, infraspinatus and teres major. Reports mild pain in UT  and infraspinatus in  TREATMENT DATE: 10/16/2023 PT evaluation.  HEP Provided as listed below    PATIENT EDUCATION: Education details: POC. Benefits of PT treatment.  Person educated: Patient Education method: Medical illustrator Education comprehension: verbalized understanding and returned demonstration  HOME EXERCISE PROGRAM:  Access Code: [email or web address] URL: [email or web address] Date: 10/16/2023 Prepared by: Massie Dollar  Exercises - Seated Scapular Retraction  - 1 x daily - 7 x weekly - 3 sets - 10 reps - 2 hold - Isometric Shoulder Extension at Wall  - 1 x daily - 7 x weekly - 3 sets - 10 reps - 2 hold - Isometric Shoulder Abduction at Wall  - 1 x daily - 7 x weekly - 3 sets - 10 reps - 2 hold  ASSESSMENT:  CLINICAL IMPRESSION: Patient is a 63 y.o. women who was seen today for physical therapy evaluation and treatment for R shoulder pain. Inconsistent historian; Despite multiple positive special tests, pt reports that extension and IR to reach back are the only painful movements. Is noted to have mild scapular dyskinesia on the R side and multiple TP noted in rotator cuff muscles. HEP initiated for isometric strengthening. Pt will benefit from skilled PT to address painful movements and strength deficits  to improve function, sleep and allow ability to perform all work duties without pain.    OBJECTIVE IMPAIRMENTS: decreased ROM, decreased strength, increased edema, impaired UE functional use, and pain.   ACTIVITY LIMITATIONS: carrying, lifting, sleeping, dressing, reach over head, hygiene/grooming, and caring for others  PARTICIPATION LIMITATIONS: meal prep, cleaning, laundry, shopping, community activity, and occupation  PERSONAL FACTORS: Behavior pattern, Fitness, Past/current experiences, and Profession are also affecting patient's functional outcome.   REHAB POTENTIAL: Good  CLINICAL DECISION MAKING: Stable/uncomplicated  EVALUATION COMPLEXITY: Low  GOALS: Goals reviewed with patient? Yes  SHORT TERM GOALS: Target date: 11/14/2023    Patient will be independent in home exercise program to improve strength/mobility for better functional independence with ADLs. Baseline: provided  Goal status: INITIAL   LONG TERM GOALS: Target date: 01/09/2024    Patient will increase quick Dash score by equal to or greater than 15points    to demonstrate statistically significant improvement in  mobility and quality of life.  Baseline: 70.4 Goal status: INITIAL  2.  Patient (> 69 years old) will reports no pain with hygiene and dressing tasks  Baseline: 8/10  Goal status: INITIAL  3.  Patient will increase R shoulder strength to grossly 4+/5 without pain  Baseline: 4 to 4+ with pain  Goal status: INITIAL  4.  Patient will be able to carry 10-15# box without pain to perform work functions  Baseline: pain with lifting  Goal status: INITIAL  5.  Patient will report sleeping through the night with no pain Baseline: waking every 2-4 hours due to shoulder pain. Goal status: INITIAL   PLAN:  PT FREQUENCY: 1-2x/week  PT DURATION: 12 weeks  PLANNED INTERVENTIONS: 97164- PT Re-evaluation, 97750- Physical Performance Testing, 97110-Therapeutic exercises, 97530- Therapeutic activity, [id number]- Neuromuscular re-education, 97535- Self Care, [id number]- Manual therapy, [id number]- Electrical stimulation (unattended), [phone]- Electrical stimulation (manual), Taping, Joint mobilization, Joint manipulation, Spinal manipulation, Spinal mobilization, Cryotherapy, Moist heat, and therapeutic  Dry needling.   PLAN FOR NEXT SESSION:  Review HEP.  Therex for scapular mobility and strengthening.  Manual for TP release.  Education for possible TDN   Massie FORBES Dollar, PT 10/16/2023, 9:28 AM

## 2023-10-17 ENCOUNTER — Ambulatory Visit
Admission: RE | Admit: 2023-10-17 | Discharge: 2023-10-17 | Disposition: A | Discharge status: Home / Self Care | Source: Ambulatory Visit | Attending: Licensed Practical Nurse | Admitting: Licensed Practical Nurse

## 2023-10-17 DIAGNOSIS — Z1231 Encounter for screening mammogram for malignant neoplasm of breast: Secondary | ICD-10-CM | POA: Insufficient documentation

## 2023-10-17 DIAGNOSIS — Z01419 Encounter for gynecological examination (general) (routine) without abnormal findings: Secondary | ICD-10-CM | POA: Diagnosis present

## 2023-10-17 DIAGNOSIS — Z1239 Encounter for other screening for malignant neoplasm of breast: Secondary | ICD-10-CM

## 2023-10-21 ENCOUNTER — Ambulatory Visit: Admitting: Physical Therapy

## 2023-10-21 ENCOUNTER — Other Ambulatory Visit: Payer: Self-pay | Admitting: Licensed Practical Nurse

## 2023-10-21 DIAGNOSIS — M25511 Pain in right shoulder: Secondary | ICD-10-CM | POA: Diagnosis not present

## 2023-10-21 DIAGNOSIS — R928 Other abnormal and inconclusive findings on diagnostic imaging of breast: Secondary | ICD-10-CM

## 2023-10-21 NOTE — Therapy (Addendum)
 OUTPATIENT PHYSICAL THERAPY UPPER EXTREMITY treatment   Patient Name: Kayla Washington MRN: [id number] DOB:1960-07-26, 63 y.o., female Today's Date: 10/21/2023  END OF SESSION:  PT End of Session - 10/21/23 0919     Visit Number 2    Number of Visits 24    Date for PT Re-Evaluation 01/08/24    PT Start Time 0930    PT Stop Time 1010    PT Time Calculation (min) 40 min    Activity Tolerance Patient tolerated treatment well    Behavior During Therapy [facility] for tasks assessed/performed          Past Medical History:  Diagnosis Date   Anxiety    Depression    GERD (gastroesophageal reflux disease)    Past Surgical History:  Procedure Laterality Date   ABDOMINAL HYSTERECTOMY  05/2011   Still has left ovary;  Dr. 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   Adaptation reaction 04/18/2008   Alcohol drinker 04/18/2006   Menopausal and perimenopausal disorder 04/18/2006    PCP: Joshua Cathryne BROCKS, MD   REFERRING PROVIDER:    Delinda Jinnie Jansky, CNM    REFERRING DIAG:  Diagnosis  M25.511 (ICD-10-CM) - Right shoulder pain, unspecified chronicity    THERAPY DIAG:  Right shoulder pain, unspecified chronicity  Rationale for Evaluation and Treatment: Rehabilitation  ONSET DATE: >1 month  SUBJECTIVE:                                                                                                                                                                                      SUBJECTIVE STATEMENT:    Pt reports that she is doing okay. Has had intermittent pain in the R shoulder. Has been completing exercises at home. But having a hard time not doing too much      From Eval. Pt reports that she is having pain with IR with reach to pants, and to clasp Bra. States that Pain may have started in end of May to beginning June. Pain is the worst at night.   Hand dominance: Left   PAIN:  Are you having pain? Yes: NPRS scale: state that the pain was not as bad last night. States that pain is only 3/10 at start of PT treatment.   Pain location: R shoulder  Pain description: tight feeling with  sharp pain with movement  Aggravating factors: sleep  Relieving factors: pain management. Ice. Feels better with movement throughout the day. Pain patch  PRECAUTIONS: None  RED FLAGS: None   WEIGHT BEARING RESTRICTIONS: No  FALLS:  Has patient fallen in last 6 months? Yes. 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   PLOF: Independent, Independent with basic ADLs, and Independent with gait  PATIENT GOALS: Get out of pain. Work and perform [employer] tasks without pain get a full night sleep.   NEXT MD VISIT: none at this time.   OBJECTIVE:  Note: Objective measures were completed at Evaluation unless otherwise noted.  DIAGNOSTIC FINDINGS:  None relevant     PATIENT SURVEYS :  Quick Dash:  QUICK DASH  Please rate your ability do the following activities in the last week by selecting the number below the appropriate response.  Quick Dash Disability/Symptom Score: [(sum of 45/ responses/11 (n)] x 25 = 70.4  Minimally Clinically Important Difference (MCID): 15-20 points  (Franchignoni, F. et al. (2013). Minimally clinically important difference of the disabilities of the arm, shoulder, and hand outcome measures (DASH) and its shortened version (Quick DASH). Journal of Orthopaedic & Sports  Physical Therapy, 44(1), 30-39)   COGNITION: Overall cognitive status: Within functional limits for tasks assessed and disorganized and verbose      SENSATION: WFL  POSTURE: Rounded shoulders decreased lumbar lordosis   UPPER EXTREMITY ROM:   Active ROM Right eval Left eval  Shoulder flexion WFL*   Shoulder extension    Shoulder abduction WFL*   Shoulder adduction WFL   Shoulder internal rotation [facility]   Shoulder external rotation 55 deg in supine 90 deg   Elbow flexion [facility] WFL  Elbow extension WFL WFL  (Blank rows = not tested)  UPPER EXTREMITY MMT:  MMT Right eval Left eval  Shoulder flexion 4 4+  Shoulder extension 4+ 4+  Shoulder abduction 4+ * 4+  Shoulder adduction 4+ 4+  Shoulder internal rotation 4 * 4  Shoulder external rotation 4- * 4  Middle trapezius    Lower trapezius    Elbow flexion 4+ 4+  Elbow extension 4+ 4+  (Blank rows = not tested)  *= pain   SHOULDER SPECIAL TESTS: Impingement tests: Neer impingement test: positive , Hawkins/Kennedy impingement test: positive , and Painful arc test: positive  SLAP lesions: Crank test: negative and Biceps load test: negative Instability tests: Load and shift test: negative and Apprehension test: negative Rotator cuff assessment: Drop arm test: negative, Empty can test: positive , and Hornblower's sign: positive  Biceps assessment: Yergason's test: negative  JOINT MOBILITY TESTING:  Grossly WFL.  Mildly reduced scapular mobility with flexion and abduction.   PALPATION:  Multiple TP noted in UT, supraspinatus, infraspinatus and teres major. Reports mild pain in UT  and infraspinatus in  TREATMENT DATE: 10/21/2023  Therex:  Isometric shoulder extension in supine x 10 bil  Shoulder ER with scapular setting x 10 YTB  Shoulder flexion to 90 deg x 10 Shoulder flexion YTB x 10 to 90 deg.    Sidelying shoulder ER AROM x 12 on the RUE.  Seated scapular retraction x 10  Seated shoulder rolls x 10  UT stretch 2 x 20 sec bil   Prone shoulder extension in pain free range x 10  Prone horizontal abduction x 10 in pain free range.  Pt demonstrates significant difficulty with stopping movement at beginning of painful range.   Manual:  STM for TP release for reduced pain and improved muscle extensibility.  Performed at  R side, UT x 3 min, Infraspinatus and teres minor/major x 4 min and mid trap/rhomboid x 4 min.  Grade 2-3 AP and inferior GH mobilizations through varied ROM from 45 deg abduction to 100deg  Pt reports mild decrease in shoulder stiffness upon completion.   PATIENT EDUCATION: Education details: POC. Benefits of PT treatment.  Pt educated throughout session about proper posture and technique with exercises. Improved exercise technique, movement at target joints, use of target muscles after min to mod verbal, visual, tactile cues.  Person educated: Patient Education method: Medical illustrator Education comprehension: verbalized understanding and returned demonstration  HOME EXERCISE PROGRAM:  Access Code: [email or web address] URL: [email or web address] Date: 10/21/2023 Prepared by: Massie Dollar  Exercises - Seated Scapular Retraction  - 1 x daily - 5 x weekly - 3 sets - 10 reps - 2 hold - Isometric Shoulder Extension at Wall  - 1 x daily - 5 x weekly - 3 sets - 10 reps - 2 hold - Isometric Shoulder Abduction at Wall  - 1 x daily - 5 x weekly - 3 sets - 10 reps - 2 hold - Seated Shoulder Rolls  - 1 x daily - 5 x weekly - 3 sets - 10 reps - Seated Upper Trapezius Stretch  - 1 x daily - 5 x weekly - 3 sets - 4 reps - 20 hold - Shoulder External Rotation and Scapular Retraction  - 1 x daily - 5 x weekly - 3 sets - 10 reps - 2 hold  ASSESSMENT:  CLINICAL IMPRESSION: Patient is a 63 y.o. women who was seen today for physical therapy treatment for R  shoulder pain. PT instructed pt in R shoulder therex for improved scapular rhythm and ROM. Instructed pt in remain in pain free ROM, but demonstrates significant difficulty with ROM modulation resulting in increased pain with shoulder extension and retraction movements. Mild improvement in shoulder stiffness following manual therapy.  Pt will benefit from skilled PT to address painful movements and strength deficits  to improve function, sleep and allow ability to perform all work duties without pain.    OBJECTIVE IMPAIRMENTS: decreased ROM, decreased strength, increased edema, impaired UE functional use, and pain.   ACTIVITY LIMITATIONS: carrying, lifting, sleeping, dressing, reach over head, hygiene/grooming, and caring for others  PARTICIPATION LIMITATIONS: meal prep, cleaning, laundry, shopping, community activity, and occupation  PERSONAL FACTORS: Behavior pattern, Fitness, Past/current experiences, and Profession are also affecting patient's functional outcome.   REHAB POTENTIAL: Good  CLINICAL DECISION MAKING: Stable/uncomplicated  EVALUATION COMPLEXITY: Low  GOALS: Goals reviewed with patient? Yes  SHORT TERM GOALS: Target date: 11/14/2023    Patient will be independent in home exercise program to improve strength/mobility for better functional independence with ADLs. Baseline: provided  Goal status: INITIAL   LONG  TERM GOALS: Target date: 01/09/2024    Patient will increase quick Dash score by equal to or greater than 15points    to demonstrate statistically significant improvement in mobility and quality of life.  Baseline: 70.4 Goal status: INITIAL  2.  Patient (> 61 years old) will reports no pain with hygiene and dressing tasks  Baseline: 8/10  Goal status: INITIAL  3.  Patient will increase R shoulder strength to grossly 4+/5 without pain  Baseline: 4 to 4+ with pain  Goal status: INITIAL  4.  Patient will be able to carry 10-15# box without pain to perform  work functions  Baseline: pain with lifting  Goal status: INITIAL  5.  Patient will report sleeping through the night with no pain Baseline: waking every 2-4 hours due to shoulder pain. Goal status: INITIAL   PLAN:  PT FREQUENCY: 1-2x/week  PT DURATION: 12 weeks  PLANNED INTERVENTIONS: 97164- PT Re-evaluation, 97750- Physical Performance Testing, 97110-Therapeutic exercises, 97530- Therapeutic activity, [id number]- Neuromuscular re-education, 97535- Self Care, 02859- Manual therapy, [id number]- Electrical stimulation (unattended), [id number]- Electrical stimulation (manual), Taping, Joint mobilization, Joint manipulation, Spinal manipulation, Spinal mobilization, Cryotherapy, Moist heat, and therapeutic  Dry needling.   PLAN FOR NEXT SESSION:  Therex for scapular mobility and strengthening.  Manual for TP release.     Massie FORBES Dollar, PT 10/21/2023, 9:21 AM

## 2023-10-23 ENCOUNTER — Ambulatory Visit
Admission: RE | Admit: 2023-10-23 | Discharge: 2023-10-23 | Disposition: A | Discharge status: Home / Self Care | Source: Ambulatory Visit | Attending: Licensed Practical Nurse | Admitting: Licensed Practical Nurse

## 2023-10-23 ENCOUNTER — Encounter: Admitting: Physical Therapy

## 2023-10-23 DIAGNOSIS — R928 Other abnormal and inconclusive findings on diagnostic imaging of breast: Secondary | ICD-10-CM

## 2023-10-24 ENCOUNTER — Ambulatory Visit: Admitting: Physical Therapy

## 2023-10-24 DIAGNOSIS — M25511 Pain in right shoulder: Secondary | ICD-10-CM
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
## 2023-10-27 ENCOUNTER — Ambulatory Visit: Admitting: Physical Therapy

## 2023-10-27 DIAGNOSIS — M25511 Pain in right shoulder: Secondary | ICD-10-CM
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
## 2023-10-28 ENCOUNTER — Encounter: Payer: Self-pay | Admitting: Obstetrics & Gynecology

## 2023-10-28 ENCOUNTER — Ambulatory Visit: Admitting: Obstetrics & Gynecology

## 2023-10-28 ENCOUNTER — Ambulatory Visit: Admitting: Physical Therapy

## 2023-10-28 VITALS — BP 144/84 | HR 68 | Ht 67.0 in | Wt 187.9 lb

## 2023-10-28 DIAGNOSIS — R87811 Vaginal high risk human papillomavirus (HPV) DNA test positive: Secondary | ICD-10-CM | POA: Diagnosis not present

## 2023-10-28 DIAGNOSIS — R87620 Atypical squamous cells of undetermined significance on cytologic smear of vagina (ASC-US): Secondary | ICD-10-CM

## 2023-10-28 NOTE — Patient Instructions (Signed)
 Colposcopy, Care After  The following information offers guidance on how to care for yourself after your procedure. Your doctor may also give you more specific instructions. If you have problems or questions, contact your doctor. What can I expect after the procedure? If you did not have a sample of your tissue taken out (did not have a biopsy), you may only have some spotting of blood for a few days. You can go back to your normal activities. If you had a sample of your tissue taken out, it is common to have: Soreness and mild pain. These may last for a few days. Mild bleeding or fluid (discharge) coming from your vagina. The fluid will look dark and grainy. You may have this for a few days. The fluid may be caused by a liquid that was used during your procedure. You may need to wear a sanitary pad. Spotting of blood for at least 48 hours after the procedure. Follow these instructions at home: Medicines Take over-the-counter and prescription medicines only as told by your doctor. Ask your doctor what over-the-counter pain medicines and prescription medicines you can start taking again. This is very important if you take blood thinners. Activity For at least 3 days, or for as long as told by your doctor, avoid: Douching. Using tampons. Having sex. Return to your normal activities as told by your doctor. Ask your doctor what activities are safe for you. General instructions Ask your doctor if you may take baths, swim, or use a hot tub. You may take showers. If you use birth control (contraception), keep using it. Keep all follow-up visits. Contact a doctor if: You have a fever or chills. You faint or feel light-headed. Get help right away if: You bleed a lot from your vagina. A lot of bleeding means that the bleeding soaks through a pad in less than 1 hour. You have clumps of blood (blood clots) coming from your vagina. You have signs that could mean you have an infection. This may be  fluid coming from your vagina that is: Different than normal. Yellow. Bad-smelling. You have very bad pain or cramps in your lower belly that do not get better with medicine. Summary If you did not have a sample of your tissue taken out, you may only have some spotting of blood for a few days. You can go back to your normal activities. If you had a sample of your tissue taken out, it is common to have mild pain for a few days and spotting for 48 hours. Avoid douching, using tampons, and having sex for at least 3 days after the procedure or for as long as told. Get help right away if you have a lot of bleeding, very bad pain, or signs of infection. This information is not intended to replace advice given to you by your health care provider. Make sure you discuss any questions you have with your health care provider. Document Revised: 08/13/2020 Document Reviewed: 08/13/2020 Elsevier Patient Education  2024 [employer].

## 2023-10-28 NOTE — Progress Notes (Signed)
    GYNECOLOGY PROGRESS NOTE  Subjective:    Patient ID: Kayla Washington, female    DOB: 11/24/1960, 63 y.o.   MRN: [id number]  HPI  Patient is a 63 y.o. divorced G2P2002 here for a colpo due to ASCUS + HR HPV of vaginal cuff. She had a hysterectomy (and removal of one ovary?) in the distant past for benign indications. She is abstinent for several years. 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  The following portions of the patient's history were reviewed and updated as appropriate: allergies, current medications, past family history, past medical history, past social history, past surgical history, and problem list.  Review of Systems Pertinent items are noted in HPI.  Colonoscopy scheduled for next week.  Objective:   Blood pressure (!) 144/84, pulse 68, height 5' 7 (1.702 m), weight 187 lb 14.4 oz (85.2 kg). Body mass index is 29.43 kg/m. Well nourished, well hydrated White female, no apparent distress She is ambulating and conversing normally. Consent signed, time out done Speculum placed. Swabs with acetic acid placed in the vagina and left to sit for 5 minutes Colposcopic findings normal.  She tolerated the procedure well.    Assessment:  Vaginal cuff pap ASCUS + HR HPV with h/o VAIN 1 No evidence of disease today  Plan:   Plan for repeat pap in a year. I rec'd vaginal estrogen 2-3 nights per week for at least 3 months before her next pap. She will call and request a prescription when this is needed.

## 2023-10-30 ENCOUNTER — Ambulatory Visit: Admitting: Physical Therapy

## 2023-10-30 ENCOUNTER — Encounter: Admitting: Physical Therapy

## 2023-10-30 DIAGNOSIS — M25511 Pain in right shoulder: Secondary | ICD-10-CM | POA: Diagnosis not present

## 2023-10-30 NOTE — Therapy (Signed)
 OUTPATIENT PHYSICAL THERAPY UPPER EXTREMITY treatment   Patient Name: Kayla Washington MRN: [id number] DOB:Feb 23, 1961, 63 y.o., female Today's Date: 10/30/2023  END OF SESSION:  PT End of Session - 10/30/23 0930     Visit Number 5    Number of Visits 24    Date for PT Re-Evaluation 01/08/24    PT Start Time 0935    PT Stop Time 1015    PT Time Calculation (min) 40 min    Activity Tolerance Patient tolerated treatment well    Behavior During Therapy [facility] for tasks assessed/performed           Past Medical History:  Diagnosis Date   Anxiety    Depression    GERD (gastroesophageal reflux disease)    Past Surgical History:  Procedure Laterality Date   ABDOMINAL HYSTERECTOMY  05/2011   Still has left ovary;  Dr. 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   Adaptation reaction 04/18/2008   Alcohol drinker 04/18/2006   Menopausal and perimenopausal disorder 04/18/2006    PCP: Joshua Cathryne BROCKS, MD   REFERRING PROVIDER:    Delinda Jinnie Jansky, CNM    REFERRING DIAG:  Diagnosis  M25.511 (ICD-10-CM) - Right shoulder pain, unspecified chronicity    THERAPY DIAG:  Right shoulder pain, unspecified chronicity  Rationale for Evaluation and Treatment: Rehabilitation  ONSET DATE: >1 month  SUBJECTIVE:                                                                                                                                                                                      SUBJECTIVE STATEMENT:   Pt reports that she is doing well. Reports that she received good report from gastroenterology test.  Will miss the next few weeks of PT possibly due to medical procedures  for her, and family.  States that she was able to perform yard work without pain.   Wants to wait until at least Aug 12th to perform TDN   From Eval. Pt reports that she is having pain with IR with reach to pants, and to clasp Bra. States that Pain may have started in end of May to beginning June. Pain is the worst at night.   Hand dominance: Left   PAIN:  Are you having pain? Yes: NPRS scale: state that the pain was not  as bad last night. States that pain is only 3/10 at start of PT treatment.   Pain location: R shoulder  Pain description: tight feeling with sharp pain with movement  Aggravating factors: sleep  Relieving factors: pain management. Ice. Feels better with movement throughout the day. Pain patch  PRECAUTIONS: None  RED FLAGS: None   WEIGHT BEARING RESTRICTIONS: No  FALLS:  Has patient fallen in last 6 months? Yes. 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   PLOF: Independent, Independent with basic ADLs, and Independent with gait  PATIENT GOALS: Get out of pain. Work and perform [employer] tasks without pain get a full night sleep.   NEXT MD VISIT: none at this time.   OBJECTIVE:  Note: Objective measures were completed at Evaluation unless otherwise noted.  DIAGNOSTIC FINDINGS:  None relevant     PATIENT SURVEYS :  Quick Dash:  QUICK DASH  Please rate your ability do the following activities in the last week by selecting the number below the appropriate response.  Quick Dash Disability/Symptom Score: [(sum of 45/ responses/11 (n)] x 25 = 70.4  Minimally Clinically Important Difference (MCID): 15-20 points  (Franchignoni, F. et al. (2013). Minimally clinically important difference of the  disabilities of the arm, shoulder, and hand outcome measures (DASH) and its shortened version (Quick DASH). Journal of Orthopaedic & Sports Physical Therapy, 44(1), 30-39)   COGNITION: Overall cognitive status: Within functional limits for tasks assessed and disorganized and verbose      SENSATION: WFL  POSTURE: Rounded shoulders decreased lumbar lordosis   UPPER EXTREMITY ROM:   Active ROM Right eval Left eval  Shoulder flexion WFL*   Shoulder extension    Shoulder abduction WFL*   Shoulder adduction WFL   Shoulder internal rotation [facility]   Shoulder external rotation 55 deg in supine 90 deg   Elbow flexion [facility] WFL  Elbow extension WFL WFL  (Blank rows = not tested)  UPPER EXTREMITY MMT:  MMT Right eval Left eval  Shoulder flexion 4 4+  Shoulder extension 4+ 4+  Shoulder abduction 4+ * 4+  Shoulder adduction 4+ 4+  Shoulder internal rotation 4 * 4  Shoulder external rotation 4- * 4  Middle trapezius    Lower trapezius    Elbow flexion 4+ 4+  Elbow extension 4+ 4+  (Blank rows = not tested)  *= pain   SHOULDER SPECIAL TESTS: Impingement tests: Neer impingement test: positive , Hawkins/Kennedy impingement test: positive , and Painful arc test: positive  SLAP lesions: Crank test: negative and Biceps load test: negative Instability tests: Load and shift test: negative and Apprehension test: negative Rotator cuff assessment: Drop arm test: negative, Empty can test: positive , and Hornblower's sign: positive  Biceps assessment: Yergason's test: negative  JOINT MOBILITY TESTING:  Grossly WFL.  Mildly reduced scapular mobility with flexion and abduction.   PALPATION:  Multiple TP noted in UT, supraspinatus, infraspinatus and teres major. Reports mild pain in UT  and infraspinatus in  TREATMENT DATE: 10/30/2023  Therex:  Shoulder abduction/ER to  touch head x 15 tactile assist for improved scapular rhythm.  Seated ER with RTB x 12 cues for decreased speed of movement  Low row x 12 RTB  Close grip row x 12 RTB  Seated UT stretch 2 x 20 sec  Seated levator stretch 2 x 20 sec  Hand out provided for cervical paraspinal stretches.  Prone:  Shoulder ER/IR x 12 bil  Scaption x 12 bil  Horizontal abduction  x 10   Manual: STM fot UT/levator TP release x 6 min  T2-7 PA grade 2-3 mobilizations. 40 sec per segment  T2-6 UPA grade 2-3 mobilizations 30 sec per segment.   Pt reports feeling release is R shoulder upon completion.     PATIENT EDUCATION: Education details: POC. Benefits of PT treatment.  Pt educated throughout session about proper posture and technique with exercises. Improved exercise technique, movement at target joints, use of target muscles after min to mod verbal, visual, tactile cues.  Possible benefits for TDN.   Person educated: Patient Education method: Medical illustrator Education comprehension: verbalized understanding and returned demonstration  HOME EXERCISE PROGRAM:  Access Code: [email or web address] URL: [email or web address] Date: 10/21/2023 Prepared by: Massie Dollar  Exercises - Seated Scapular Retraction  - 1 x daily - 5 x weekly - 3 sets - 10 reps - 2 hold - Isometric Shoulder Extension at Wall  - 1 x daily - 5 x weekly - 3 sets - 10 reps - 2 hold - Isometric Shoulder Abduction at Wall  - 1 x daily - 5 x weekly - 3 sets - 10 reps - 2 hold - Seated Shoulder Rolls  - 1 x daily - 5 x weekly - 3 sets - 10 reps - Seated Upper Trapezius Stretch  - 1 x daily - 5 x weekly - 3 sets - 4 reps - 20 hold - Shoulder External Rotation and Scapular Retraction  - 1 x daily - 5 x weekly - 3 sets - 10 reps - 2 hold  ASSESSMENT:  CLINICAL IMPRESSION: Patient is a 63 y.o. women who was seen today for physical therapy treatment for R shoulder pain. PT instructed pt in R shoulder therex for improved  ROM and strength. Increased time spent on ROM and muscle extensibility on this day. Pt also reports that pain with ER is isolated to insertion of levator and proximal UT. Will continue to address concordant TP and reduced muscle extensibility with manual and TDN as appropriate. Adjusted HEP to include levator and UT stretching.  Pt will benefit from skilled PT to address painful movements and strength deficits  to improve function, sleep and allow ability to perform all work duties without pain.    OBJECTIVE IMPAIRMENTS: decreased ROM, decreased strength, increased edema, impaired UE functional use, and pain.   ACTIVITY LIMITATIONS: carrying, lifting, sleeping, dressing, reach over head, hygiene/grooming, and caring for others  PARTICIPATION LIMITATIONS: meal prep, cleaning, laundry, shopping, community activity, and occupation  PERSONAL FACTORS: Behavior pattern, Fitness, Past/current experiences, and Profession are also affecting patient's functional outcome.   REHAB POTENTIAL: Good  CLINICAL DECISION MAKING: Stable/uncomplicated  EVALUATION COMPLEXITY: Low  GOALS: Goals reviewed with patient? Yes  SHORT TERM GOALS: Target date: 11/14/2023    Patient will be independent in home exercise program to improve strength/mobility for better functional independence with ADLs. Baseline: provided  Goal status: INITIAL   LONG TERM GOALS: Target date: 01/09/2024    Patient will increase quick Dash score  by equal to or greater than 15points    to demonstrate statistically significant improvement in mobility and quality of life.  Baseline: 70.4 Goal status: INITIAL  2.  Patient (> 63 years old) will reports no pain with hygiene and dressing tasks  Baseline: 8/10  Goal status: INITIAL  3.  Patient will increase R shoulder strength to grossly 4+/5 without pain  Baseline: 4 to 4+ with pain  Goal status: INITIAL  4.  Patient will be able to carry 10-15# box without pain to perform work  functions  Baseline: pain with lifting  Goal status: INITIAL  5.  Patient will report sleeping through the night with no pain Baseline: waking every 2-4 hours due to shoulder pain. Goal status: INITIAL   PLAN:  PT FREQUENCY: 1-2x/week  PT DURATION: 12 weeks  PLANNED INTERVENTIONS: 97164- PT Re-evaluation, 97750- Physical Performance Testing, 97110-Therapeutic exercises, 97530- Therapeutic activity, [id number]- Neuromuscular re-education, 97535- Self Care, [id number]- Manual therapy, [id number]- Electrical stimulation (unattended), [phone]- Electrical stimulation (manual), Taping, Joint mobilization, Joint manipulation, Spinal manipulation, Spinal mobilization, Cryotherapy, Moist heat, and therapeutic  Dry needling.   PLAN FOR NEXT SESSION:  Therex for scapular mobility and strengthening.  Manual for TP release.  Possible TDN    Massie FORBES Dollar, PT 10/30/2023, 9:38 AM

## 2023-10-31 ENCOUNTER — Ambulatory Visit: Attending: Licensed Practical Nurse | Admitting: Physical Therapy

## 2023-10-31 DIAGNOSIS — M25511 Pain in right shoulder: Secondary | ICD-10-CM | POA: Diagnosis present

## 2023-10-31 NOTE — Therapy (Signed)
 OUTPATIENT PHYSICAL THERAPY UPPER EXTREMITY treatment   Patient Name: Kayla Washington MRN: [id number] DOB:06-04-1960, 63 y.o., female Today's Date: 10/31/2023  END OF SESSION:  PT End of Session - 10/31/23 0928     Visit Number 6    Number of Visits 24    Date for PT Re-Evaluation 01/08/24    PT Start Time 0931    PT Stop Time 1011    PT Time Calculation (min) 40 min    Activity Tolerance Patient tolerated treatment well    Behavior During Therapy [facility] for tasks assessed/performed           Past Medical History:  Diagnosis Date   Anxiety    Depression    GERD (gastroesophageal reflux disease)    Past Surgical History:  Procedure Laterality Date   ABDOMINAL HYSTERECTOMY  05/2011   Still has left ovary;  Dr. 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   Adaptation reaction 04/18/2008   Alcohol drinker 04/18/2006   Menopausal and perimenopausal disorder 04/18/2006    PCP: Joshua Cathryne BROCKS, MD   REFERRING PROVIDER:    Delinda Jinnie Jansky, CNM    REFERRING DIAG:  Diagnosis  M25.511 (ICD-10-CM) - Right shoulder pain, unspecified chronicity    THERAPY DIAG:  Right shoulder pain, unspecified chronicity  Rationale for Evaluation and Treatment: Rehabilitation  ONSET DATE: >1 month  SUBJECTIVE:                                                                                                                                                                                      SUBJECTIVE STATEMENT:   Pt reports that she is doing well overall. Was able to sleep through the night without pain meds, but reports mild discomfort this morning. Had a hard time waking up this AM  due to cloudy/dreary weather this morning.   Wants to wait until at least Aug 12th to perform TDN   From Eval. Pt reports that she is having pain with IR with reach to pants, and to clasp Bra. States that Pain may have started in end of May to beginning June. Pain is the worst at night.   Hand dominance: Left   PAIN:  Are you having pain? Yes: NPRS scale: state that the pain was not as bad last night. States that pain is  only 3/10 at start of PT treatment.   Pain location: R shoulder  Pain description: tight feeling with sharp pain with movement  Aggravating factors: sleep  Relieving factors: pain management. Ice. Feels better with movement throughout the day. Pain patch  PRECAUTIONS: None  RED FLAGS: None   WEIGHT BEARING RESTRICTIONS: No  FALLS:  Has patient fallen in last 6 months? Yes. 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   PLOF: Independent, Independent with basic ADLs, and Independent with gait  PATIENT GOALS: Get out of pain. Work and perform [employer] tasks without pain get a full night sleep.   NEXT MD VISIT: none at this time.   OBJECTIVE:  Note: Objective measures were completed at Evaluation unless otherwise noted.  DIAGNOSTIC FINDINGS:  None relevant     PATIENT SURVEYS :  Quick Dash:  QUICK DASH  Please rate your ability do the following activities in the last week by selecting the number below the appropriate response.  Quick Dash Disability/Symptom Score: [(sum of 45/ responses/11 (n)] x 25 = 70.4  Minimally Clinically Important Difference (MCID): 15-20 points  (Franchignoni, F. et al. (2013). Minimally clinically important difference of the disabilities of the arm, shoulder, and hand  outcome measures (DASH) and its shortened version (Quick DASH). Journal of Orthopaedic & Sports Physical Therapy, 44(1), 30-39)   COGNITION: Overall cognitive status: Within functional limits for tasks assessed and disorganized and verbose      SENSATION: WFL  POSTURE: Rounded shoulders decreased lumbar lordosis   UPPER EXTREMITY ROM:   Active ROM Right eval Left eval  Shoulder flexion WFL*   Shoulder extension    Shoulder abduction WFL*   Shoulder adduction WFL   Shoulder internal rotation [facility]   Shoulder external rotation 55 deg in supine 90 deg   Elbow flexion [facility] WFL  Elbow extension WFL WFL  (Blank rows = not tested)  UPPER EXTREMITY MMT:  MMT Right eval Left eval  Shoulder flexion 4 4+  Shoulder extension 4+ 4+  Shoulder abduction 4+ * 4+  Shoulder adduction 4+ 4+  Shoulder internal rotation 4 * 4  Shoulder external rotation 4- * 4  Middle trapezius    Lower trapezius    Elbow flexion 4+ 4+  Elbow extension 4+ 4+  (Blank rows = not tested)  *= pain   SHOULDER SPECIAL TESTS: Impingement tests: Neer impingement test: positive , Hawkins/Kennedy impingement test: positive , and Painful arc test: positive  SLAP lesions: Crank test: negative and Biceps load test: negative Instability tests: Load and shift test: negative and Apprehension test: negative Rotator cuff assessment: Drop arm test: negative, Empty can test: positive , and Hornblower's sign: positive  Biceps assessment: Yergason's test: negative  JOINT MOBILITY TESTING:  Grossly WFL.  Mildly reduced scapular mobility with flexion and abduction.   PALPATION:  Multiple TP noted in UT, supraspinatus, infraspinatus and teres major. Reports mild pain in UT  and infraspinatus in  TREATMENT DATE: 10/31/2023  Therex:  UBE forward x 4 min 1 min rest break then 2 min reverse.  Supine:   Bil Shoulder ER/IR in pain free range with shoulder at 80-90 deg abduction  x 15 each  Chest press plus with PVC pipe x 12  Shoulder ER with scap setting x 12  RTB  Shoulder flexion to ~80 deg 1# DB Shoulder ER in sidelying x 15 1#DB Shoulder IR in sidelying x 15 YTB Isometric shoulder extension into mat x 12  PROM shoulder flexion x 5   Manual:  Shoulder ER/IR at 90 deg PROM with overpressure from PT x 6 bil with 10 sec hold each  Contract relax with ER/IR 3 sec hold 20 sec relax.  STM to R side UT/levator 2 x 1 min  UT stretch 2 x 45 sec  Levator stretch 2 x 45 sec  Suboccipial release x 1 min      Pt reports feeling release is R shoulder upon completion.     PATIENT EDUCATION: Education details: POC. Benefits of PT treatment.  Pt educated throughout session about proper posture and technique with exercises. Improved exercise technique, movement at target joints, use of target muscles after min to mod verbal, visual, tactile cues.  Possible benefits for TDN.   Person educated: Patient Education method: Medical illustrator Education comprehension: verbalized understanding and returned demonstration  HOME EXERCISE PROGRAM:  Access Code: [email or web address] URL: [email or web address] Date: 10/21/2023 Prepared by: Massie Dollar  Exercises - Seated Scapular Retraction  - 1 x daily - 5 x weekly - 3 sets - 10 reps - 2 hold - Isometric Shoulder Extension at Wall  - 1 x daily - 5 x weekly - 3 sets - 10 reps - 2 hold - Isometric Shoulder Abduction at Wall  - 1 x daily - 5 x weekly - 3 sets - 10 reps - 2 hold - Seated Shoulder Rolls  - 1 x daily - 5 x weekly - 3 sets - 10 reps - Seated Upper Trapezius Stretch  - 1 x daily - 5 x weekly - 3 sets - 4 reps - 20 hold - Shoulder External Rotation and Scapular Retraction  - 1 x daily - 5 x weekly - 3 sets - 10 reps - 2 hold  ASSESSMENT:  CLINICAL IMPRESSION: Patient is a 63 y.o. women who was seen today for physical  therapy treatment for R shoulder pain. PT instructed pt in R shoulder therex for improved ROM and strength. Increased time spent on ROM and muscle extensibility on this day. PT treatment intensity increased in either repetitions or resistance on this day. Tolerated well, with reported decreased tension in the UT region upon completion.  Pt will benefit from skilled PT to address painful movements and strength deficits  to improve function, sleep and allow ability to perform all work duties without pain.    OBJECTIVE IMPAIRMENTS: decreased ROM, decreased strength, increased edema, impaired UE functional use, and pain.   ACTIVITY LIMITATIONS: carrying, lifting, sleeping, dressing, reach over head, hygiene/grooming, and caring for others  PARTICIPATION LIMITATIONS: meal prep, cleaning, laundry, shopping, community activity, and occupation  PERSONAL FACTORS: Behavior pattern, Fitness, Past/current experiences, and Profession are also affecting patient's functional outcome.   REHAB POTENTIAL: Good  CLINICAL DECISION MAKING: Stable/uncomplicated  EVALUATION COMPLEXITY: Low  GOALS: Goals reviewed with patient? Yes  SHORT TERM GOALS: Target date: 11/14/2023    Patient will be independent in home exercise program to improve strength/mobility for better functional independence with  ADLs. Baseline: provided  Goal status: INITIAL   LONG TERM GOALS: Target date: 01/09/2024    Patient will increase quick Dash score by equal to or greater than 15points    to demonstrate statistically significant improvement in mobility and quality of life.  Baseline: 70.4 Goal status: INITIAL  2.  Patient (> 10 years old) will reports no pain with hygiene and dressing tasks  Baseline: 8/10  Goal status: INITIAL  3.  Patient will increase R shoulder strength to grossly 4+/5 without pain  Baseline: 4 to 4+ with pain  Goal status: INITIAL  4.  Patient will be able to carry 10-15# box without pain to perform  work functions  Baseline: pain with lifting  Goal status: INITIAL  5.  Patient will report sleeping through the night with no pain Baseline: waking every 2-4 hours due to shoulder pain. Goal status: INITIAL   PLAN:  PT FREQUENCY: 1-2x/week  PT DURATION: 12 weeks  PLANNED INTERVENTIONS: 97164- PT Re-evaluation, 97750- Physical Performance Testing, 97110-Therapeutic exercises, 97530- Therapeutic activity, [id number]- Neuromuscular re-education, 97535- Self Care, 02859- Manual therapy, G0283- Electrical stimulation (unattended), [phone]- Electrical stimulation (manual), Taping, Joint mobilization, Joint manipulation, Spinal manipulation, Spinal mobilization, Cryotherapy, Moist heat, and therapeutic  Dry needling.   PLAN FOR NEXT SESSION:  Therex for scapular mobility and strengthening.  Manual for TP release.  Possible TDN    Massie FORBES Dollar, PT 10/31/2023, 9:30 AM

## 2023-11-04 ENCOUNTER — Encounter: Admitting: Physical Therapy

## 2023-11-05 ENCOUNTER — Other Ambulatory Visit: Payer: Self-pay

## 2023-11-05 ENCOUNTER — Encounter: Payer: Self-pay | Admitting: General Surgery

## 2023-11-05 ENCOUNTER — Encounter
Admission: RE | Disposition: A | Discharge status: Home / Self Care | Payer: Self-pay | Source: Home / Self Care | Attending: General Surgery

## 2023-11-05 ENCOUNTER — Ambulatory Visit: Admitting: Certified Registered Nurse Anesthetist

## 2023-11-05 ENCOUNTER — Ambulatory Visit
Admission: RE | Admit: 2023-11-05 | Discharge: 2023-11-05 | Disposition: A | Discharge status: Home / Self Care | Attending: General Surgery | Admitting: General Surgery

## 2023-11-05 DIAGNOSIS — F419 Anxiety disorder, unspecified: Secondary | ICD-10-CM | POA: Diagnosis not present

## 2023-11-05 DIAGNOSIS — Z1211 Encounter for screening for malignant neoplasm of colon: Secondary | ICD-10-CM | POA: Diagnosis present

## 2023-11-05 DIAGNOSIS — K219 Gastro-esophageal reflux disease without esophagitis: Secondary | ICD-10-CM | POA: Insufficient documentation

## 2023-11-05 DIAGNOSIS — F32A Depression, unspecified: Secondary | ICD-10-CM | POA: Insufficient documentation

## 2023-11-05 DIAGNOSIS — K5730 Diverticulosis of large intestine without perforation or abscess without bleeding: Secondary | ICD-10-CM | POA: Diagnosis not present

## 2023-11-05 DIAGNOSIS — Z79899 Other long term (current) drug therapy: Secondary | ICD-10-CM | POA: Diagnosis not present

## 2023-11-05 HISTORY — PX: COLONOSCOPY: SHX5424

## 2023-11-05 SURGERY — COLONOSCOPY
Anesthesia: General

## 2023-11-05 MED ORDER — SODIUM CHLORIDE 0.9 % IV SOLN: INTRAVENOUS | Status: DC

## 2023-11-05 MED ORDER — LIDOCAINE HCL (CARDIAC) PF 100 MG/5ML IV SOSY
PREFILLED_SYRINGE | INTRAVENOUS | Status: DC | PRN
  Administered 2023-11-05: 50 mg via INTRAVENOUS

## 2023-11-05 MED ORDER — GLYCOPYRROLATE 0.2 MG/ML IJ SOLN
INTRAMUSCULAR | Status: AC
  Filled 2023-11-05: qty 1

## 2023-11-05 MED ORDER — PROPOFOL 500 MG/50ML IV EMUL
INTRAVENOUS | Status: DC | PRN
  Administered 2023-11-05: 170 ug/kg/min via INTRAVENOUS

## 2023-11-05 MED ORDER — GLYCOPYRROLATE 0.2 MG/ML IJ SOLN
INTRAMUSCULAR | Status: DC | PRN
  Administered 2023-11-05: .2 mg via INTRAVENOUS

## 2023-11-05 MED ORDER — PROPOFOL 10 MG/ML IV BOLUS
INTRAVENOUS | Status: DC | PRN
  Administered 2023-11-05: 70 mg via INTRAVENOUS
  Administered 2023-11-05: 20 mg via INTRAVENOUS

## 2023-11-05 NOTE — H&P (Signed)
 Primary Care Physician:  Joshua Cathryne BROCKS, MD (Inactive) Primary Gastroenterologist:  Dr. Marinda  Pre-Procedure History & Physical: HPI:  Kayla Washington is a 63 y.o. female is here for an colonoscopy.   Past Medical History:  Diagnosis Date   Anxiety    Depression    GERD (gastroesophageal reflux disease)     Past Surgical History:  Procedure Laterality Date   ABDOMINAL HYSTERECTOMY  05/2011   Still has left ovary;  Dr. Van Dalen   COLONOSCOPY  05/2014   TUBAL LIGATION      Prior to Admission medications   Medication Sig Start Date End Date Taking? Authorizing Provider  aspirin EC 81 MG tablet Take 81 mg by mouth daily. Swallow whole.   Yes [provider]  cimetidine (TAGAMET) 200 MG tablet Take 200 mg by mouth 2 (two) times daily.   Yes [provider]  FLUoxetine  (PROZAC ) 10 MG tablet To take q day 05/29/23  Yes Joshua Cathryne BROCKS, MD  FLUoxetine  (PROZAC ) 20 MG capsule Take 1 capsule (20 mg total) by mouth daily. 05/29/23  Yes Joshua Cathryne BROCKS, MD  ibuprofen  (ADVIL ) 600 MG tablet Take 1 tablet (600 mg total) by mouth every 6 (six) hours as needed. 12/28/20  Yes Schuman, Christanna R, MD  omeprazole (PRILOSEC OTC) 20 MG tablet Take 20 mg by mouth as needed.   Yes [provider]  pantoprazole  (PROTONIX ) 40 MG tablet Take 1 tablet (40 mg total) by mouth daily. 08/14/23  Yes Joshua Cathryne BROCKS, MD    Allergies as of 09/29/2023   (No Known Allergies)    Family History  Problem Relation Age of Onset   Stroke Mother    Diabetes Mother    Colon polyps Mother    Hypertension Father    Heart disease Father    Heart attack Father    Throat cancer Father    Healthy Brother    Hypertension Other    Hyperlipidemia Other    Heart disease Other    Diabetes Other    Breast cancer Neg Hx     Social History   Socioeconomic History   Marital status: Divorced    Spouse name: Not on file   Number of children: 2   Years of education: H/S   Highest education  level: High school graduate  Occupational History   Occupation: Tree surgeon at [employer]    Comment: Full-Time  Tobacco Use   Smoking status: Never   Smokeless tobacco: Never  Vaping Use   Vaping status: Never Used  Substance and Sexual Activity   Alcohol use: Yes    Alcohol/week: 12.0 - 16.0 standard drinks of alcohol    Types: 12 - 16 Cans of beer per week    Comment: per week   Drug use: No   Sexual activity: Not Currently    Birth control/protection: Surgical    Comment: Hysterectomy  Other Topics Concern   Not on file  Social History Narrative   Not on file   Social Drivers of Health   Financial Resource Strain: Low Risk  (06/09/2017)   Overall Financial Resource Strain (CARDIA)    Difficulty of Paying Living Expenses: Not hard at all  Food Insecurity: No Food Insecurity (05/01/2023)   Hunger Vital Sign    Worried About Running Out of Food in the Last Year: Never true    Ran Out of Food in the Last Year: Never true  Transportation Needs: No Transportation Needs (05/01/2023)   PRAPARE -  Administrator, Civil Service (Medical): No    Lack of Transportation (Non-Medical): No  Physical Activity: Inactive (06/09/2017)   Exercise Vital Sign    Days of Exercise per Week: 0 days    Minutes of Exercise per Session: 0 min  Stress: Not on file  Social Connections: Not on file  Intimate Partner Violence: Not At Risk (05/01/2023)   Humiliation, Afraid, Rape, and Kick questionnaire    Fear of Current or Ex-Partner: No    Emotionally Abused: No    Physically Abused: No    Sexually Abused: No    Review of Systems: See HPI, otherwise negative ROS  Physical Exam: BP 137/68   Pulse 62   Temp (!) 96 F (35.6 C) (Temporal)   Resp 18   Ht 5' 6 (1.676 m)   Wt 83.7 kg   SpO2 97%   BMI 29.80 kg/m  General:   Alert,  pleasant and cooperative in NAD Head:  Normocephalic and atraumatic. Neck:  Supple; no masses or thyromegaly. Lungs:  Clear throughout to  auscultation.    Heart:  Regular rate and rhythm. Abdomen:  Soft, nontender and nondistended. Normal bowel sounds, without guarding, and without rebound.   Neurologic:  Alert and  oriented x4;  grossly normal neurologically.  Impression/Plan: Kayla Washington is here for an colonoscopy to be performed for screening  Risks, benefits, limitations, and alternatives regarding  colonoscopy have been reviewed with the patient.  Questions have been answered.  All parties agreeable.   Kayla MALVA Endow, MD  11/05/2023, 10:39 AM

## 2023-11-05 NOTE — Transfer of Care (Signed)
 Immediate Anesthesia Transfer of Care Note  Patient: Kayla Washington  Procedure(s) Performed: COLONOSCOPY  Patient Location: Endoscopy Unit  Anesthesia Type:General  Level of Consciousness: drowsy  Airway & Oxygen Therapy: Patient Spontanous Breathing  Post-op Assessment: Report given to RN and Post -op Vital signs reviewed and stable  Post vital signs: Reviewed and stable  Last Vitals:  Vitals Value Taken Time  BP    Temp    Pulse    Resp    SpO2      Last Pain:  Vitals:   11/05/23 1025  TempSrc: Temporal  PainSc: 0-No pain         Complications: No notable events documented.

## 2023-11-05 NOTE — Discharge Instructions (Signed)
YOU HAD AN ENDOSCOPIC PROCEDURE TODAY: Refer to the procedure report that was given to you for any specific questions about what was found during the examination.  If the procedure report does not answer your questions, please call your gastroenterologist to clarify. ° °YOU SHOULD EXPECT: Some feelings of bloating in the abdomen. Passage of more gas than usual.  Walking can help get rid of the air that was put into your GI tract during the procedure and reduce the bloating. If you had a lower endoscopy (such as a colonoscopy or flexible sigmoidoscopy) you may notice spotting of blood in your stool or on the toilet paper.  ° °DIET: Your first meal following the procedure should be a light meal and then it is ok to progress to your normal diet.  A half-sandwich or bowl of soup is an example of a good first meal.  Heavy or fried foods are harder to digest and may make you feel nasueas or bloated.  Drink plenty of fluids but you should avoid alcoholic beverages for 24 hours. ° °ACTIVITY: Your care partner should take you home directly after the procedure.  You should plan to take it easy, moving slowly for the rest of the day.  You can resume normal activity the day after the procedure however you should NOT DRIVE, make legal decisions or use heavy machinery for 24 hours (because of the sedation medicines used during the test).   ° °SYMPTOMS TO REPORT IMMEDIATELY  °A gastroenterologist can be reached at any hour.  Please call your doctor's office for any of the following symptoms: ° °· Following lower endoscopy (colonoscopy, flexible sigmoidoscopy) ° Excessive amounts of blood in the stool ° Significant tenderness, worsening of abdominal pains ° Swelling of the abdomen that is new, acute ° Fever of 100° or higher °· Following upper endoscopy (EGD, EUS, ERCP) ° Vomiting of blood or coffee ground material ° New, significant abdominal pain ° New, significant chest pain or pain under the shoulder blades ° Painful or  persistently difficult swallowing ° New shortness of breath ° Black, tarry-looking stools ° °FOLLOW UP: °If any biopsies were taken you will be contacted by phone or by letter within the next 1-3 weeks.  Call your gastroenterologist if you have not heard about the biopsies in 3 weeks.  ° °Please also call your gastroenterologist's office with any specific questions about appointments or follow up tests. °

## 2023-11-05 NOTE — Anesthesia Preprocedure Evaluation (Signed)
 Anesthesia Evaluation  Patient identified by MRN, date of birth, ID band Patient awake    Reviewed: Allergy & Precautions, NPO status , Patient's Chart, lab work & pertinent test results  Airway Mallampati: III  TM Distance: >3 FB Neck ROM: full    Dental  (+) Chipped   Pulmonary neg pulmonary ROS   Pulmonary exam normal        Cardiovascular negative cardio ROS Normal cardiovascular exam     Neuro/Psych  PSYCHIATRIC DISORDERS Anxiety Depression    negative neurological ROS     GI/Hepatic Neg liver ROS,GERD  Medicated and Controlled,,  Endo/Other  negative endocrine ROS    Renal/GU negative Renal ROS  negative genitourinary   Musculoskeletal   Abdominal   Peds  Hematology negative hematology ROS (+)   Anesthesia Other Findings Past Medical History: No date: Anxiety No date: Depression No date: GERD (gastroesophageal reflux disease)  Past Surgical History: 05/2011: ABDOMINAL HYSTERECTOMY     Comment:  Still has left ovary;  Dr. Fleeta Milks 05/2014: COLONOSCOPY No date: TUBAL LIGATION  BMI    Body Mass Index: 29.80 kg/m      Reproductive/Obstetrics negative OB ROS                              Anesthesia Physical Anesthesia Plan  ASA: 2  Anesthesia Plan: General   Post-op Pain Management: Minimal or no pain anticipated   Induction: Intravenous  PONV Risk Score and Plan: 2 and Propofol  infusion and TIVA  Airway Management Planned: Nasal Cannula  Additional Equipment: None  Intra-op Plan:   Post-operative Plan:   Informed Consent: I have reviewed the patients History and Physical, chart, labs and discussed the procedure including the risks, benefits and alternatives for the proposed anesthesia with the patient or authorized representative who has indicated his/her understanding and acceptance.     Dental advisory given  Plan Discussed with: CRNA and  Surgeon  Anesthesia Plan Comments: (Discussed risks of anesthesia with patient, including possibility of difficulty with spontaneous ventilation under anesthesia necessitating airway intervention, PONV, and rare risks such as cardiac or respiratory or neurological events, and allergic reactions. Discussed the role of CRNA in patient's perioperative care. Patient understands.)        Anesthesia Quick Evaluation

## 2023-11-05 NOTE — Op Note (Addendum)
 [facility] Gastroenterology Patient Name: Mahrukh Seguin Procedure Date: 11/05/2023 10:30 AM MRN: [id number] Account #: [id number] Date of Birth: [birth year] Admit Type: Outpatient Age: 63 Room: [facility] ENDO ROOM 1 Gender: Female Note Status: Supervisor Override Instrument Name: Arvis [id number] Procedure:             Colonoscopy Indications:           Screening for colorectal malignant neoplasm Providers:             Jayson KIDD. Marinda, MD Referring MD:          Cathryne KYM Molt, MD (Referring MD) Medicines:             See the Anesthesia note for documentation of the                         administered medications Complications:         No immediate complications. Estimated blood loss:                         Minimal. Procedure:             Pre-Anesthesia Assessment:                        - Prior to the procedure, a History and Physical was                         performed, and patient medications and allergies were                         reviewed. The patient's tolerance of previous                         anesthesia was also reviewed. The risks and benefits                         of the procedure and the sedation options and risks                         were discussed with the patient. All questions were                         answered, and informed consent was obtained. Prior                         Anticoagulants: The patient has taken no anticoagulant                         or antiplatelet agents. ASA Grade Assessment: II - A                         patient with mild systemic disease. After reviewing                         the risks and benefits, the patient was deemed in                         satisfactory condition to undergo the procedure.  After obtaining informed consent, the colonoscope was                         passed under direct vision. Throughout the procedure,                         the patient's blood pressure,  pulse, and oxygen                         saturations were monitored continuously. The                         Colonoscope was introduced through the anus and                         advanced to the the cecum, identified by appendiceal                         orifice and ileocecal valve. The colonoscopy was                         somewhat difficult due to a redundant colon.                         Successful completion of the procedure was aided by                         applying abdominal pressure. The quality of the bowel                         preparation was excellent. The entire colon was well                         visualized. Findings:      The perianal and digital rectal examinations were normal.      Many large-mouthed diverticula were found in the entire colon. There was       no evidence of diverticular bleeding.      The exam was otherwise without abnormality on direct and retroflexion       views. Impression:            - Severe diverticulosis in the entire examined colon.                         There was no evidence of diverticular bleeding.                        - The examination was otherwise normal on direct and                         retroflexion views.                        - No specimens collected. Recommendation:        - Discharge patient to home.                        - Resume previous diet.                        -  Repeat colonoscopy in 10 years for screening                         purposes. Procedure Code(s):     --- Professional ---                        [phone], Colonoscopy, flexible; diagnostic, including                         collection of specimen(s) by brushing or washing, when                         performed (separate procedure) CPT copyright 2022 American [employer]. All rights reserved. The codes documented in this report are preliminary and upon coder review may  be revised to meet current compliance requirements. Jayson MALVA Endow, MD 11/05/2023 11:31:30 AM Number of Addenda: 0 Note Initiated On: 11/05/2023 10:30 AM Scope Withdrawal Time: 0 hours 13 minutes 18 seconds  Total Procedure Duration: 0 hours 36 minutes 58 seconds       [facility]

## 2023-11-06 ENCOUNTER — Encounter: Admitting: Physical Therapy

## 2023-11-06 ENCOUNTER — Telehealth: Payer: Self-pay

## 2023-11-06 NOTE — Anesthesia Postprocedure Evaluation (Signed)
 Anesthesia Post Note  Patient: Kayla Washington  Procedure(s) Performed: COLONOSCOPY  Patient location during evaluation: Endoscopy Anesthesia Type: General Level of consciousness: awake and alert Pain management: pain level controlled Vital Signs Assessment: post-procedure vital signs reviewed and stable Respiratory status: spontaneous breathing, nonlabored ventilation, respiratory function stable and patient connected to nasal cannula oxygen Cardiovascular status: blood pressure returned to baseline and stable Postop Assessment: no apparent nausea or vomiting Anesthetic complications: no   No notable events documented.   Last Vitals:  Vitals:   11/05/23 1131 11/05/23 1141  BP: (!) 103/48 121/69  Pulse: 72   Resp: (!) 25 17  Temp: (!) 36.4 C   SpO2: 94%     Last Pain:  Vitals:   11/05/23 1141  TempSrc:   PainSc: 0-No pain                 Debby Mines

## 2023-11-06 NOTE — Telephone Encounter (Signed)
 Pt contacted office to inquire what the name of the vitamin was that Dr Marinda recommended her to get.  She said it was to help with Diverticulitis.  I was not able to see the recommendation in chart notes.  I informed her that I would message Dr. Marinda and ask him what it was and call her back.  Dr Marinda please advise on the vitamin recommendation for patient.  Thanks,  Robinson, CMA

## 2023-11-07 ENCOUNTER — Ambulatory Visit: Admitting: Physical Therapy

## 2023-11-07 DIAGNOSIS — M25511 Pain in right shoulder: Secondary | ICD-10-CM

## 2023-11-07 NOTE — Therapy (Signed)
 OUTPATIENT PHYSICAL THERAPY UPPER EXTREMITY treatment   Patient Name: Kayla Washington MRN: [id number] DOB:02-04-1961, 63 y.o., female Today's Date: 11/07/2023  END OF SESSION:  PT End of Session - 11/07/23 0928     Visit Number 7    Number of Visits 24    Date for PT Re-Evaluation 01/08/24    PT Start Time 0932    PT Stop Time 1012    PT Time Calculation (min) 40 min    Activity Tolerance Patient tolerated treatment well    Behavior During Therapy [facility] for tasks assessed/performed           Past Medical History:  Diagnosis Date   Anxiety    Depression    GERD (gastroesophageal reflux disease)    Past Surgical History:  Procedure Laterality Date   ABDOMINAL HYSTERECTOMY  05/2011   Still has left ovary;  Dr. Fleeta Milks   COLONOSCOPY  05/2014   COLONOSCOPY N/A 11/05/2023   Procedure: COLONOSCOPY;  Surgeon: Marinda Jayson KIDD, MD;  Location: [facility] ENDOSCOPY;  Service: General;  Laterality: N/A;   TUBAL LIGATION     Patient Active Problem List   Diagnosis Date Noted   Encounter for screening colonoscopy 11/05/2023   VAIN I (vaginal intraepithelial neoplasia grade I) 07/23/2017   S/P hysterectomy 07/11/2017   LGSIL on Pap smear of cervix 06/09/2017   Arthritis 11/04/2014   Anxiety 10/11/2014   HPV (human papilloma virus) infection 10/11/2014   Adaptation reaction 04/18/2008   Alcohol drinker 04/18/2006   Menopausal and perimenopausal disorder 04/18/2006    PCP: Joshua Cathryne BROCKS, MD   REFERRING PROVIDER:    Delinda Jinnie Jansky, CNM    REFERRING DIAG:  Diagnosis  M25.511 (ICD-10-CM) - Right shoulder pain, unspecified chronicity    THERAPY DIAG:  Right shoulder pain, unspecified chronicity  Rationale for Evaluation and Treatment: Rehabilitation  ONSET DATE: >1 month  SUBJECTIVE:                                                                                                                                                                                       SUBJECTIVE STATEMENT:   Pt reports that she is doing well did not have much pain overnight or in the morning most days, except Monday morning.   A little discomfort this morning requiring ice pack for 20-30 min   From Eval. Pt reports that she is having pain with IR with reach to pants, and to clasp Bra. States that Pain may have started in end of May to beginning June. Pain is the worst at night.   Hand dominance: Left   PAIN:  Are you having pain? Yes:  NPRS scale: state that the pain was not as bad last night. States that pain is only 3/10 at start of PT treatment.   Pain location: R shoulder  Pain description: tight feeling with sharp pain with movement  Aggravating factors: sleep  Relieving factors: pain management. Ice. Feels better with movement throughout the day. Pain patch  PRECAUTIONS: None  RED FLAGS: None   WEIGHT BEARING RESTRICTIONS: No  FALLS:  Has patient fallen in last 6 months? Yes. 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   PLOF: Independent, Independent with basic ADLs, and Independent with gait  PATIENT GOALS: Get out of pain. Work and perform [employer] tasks without pain get a full night sleep.   NEXT MD VISIT: none at this time.   OBJECTIVE:  Note: Objective measures were completed at Evaluation unless otherwise noted.  DIAGNOSTIC FINDINGS:  None relevant     PATIENT SURVEYS :  Quick Dash:  QUICK DASH  Please rate your ability do the following activities in the last week by selecting the number below the appropriate response.  Quick Dash Disability/Symptom Score: [(sum of 45/ responses/11 (n)] x 25 = 70.4  Minimally Clinically Important Difference (MCID): 15-20  points  (Franchignoni, F. et al. (2013). Minimally clinically important difference of the disabilities of the arm, shoulder, and hand outcome measures (DASH) and its shortened version (Quick DASH). Journal of Orthopaedic & Sports Physical Therapy, 44(1), 30-39)   COGNITION: Overall cognitive status: Within functional limits for tasks assessed and disorganized and verbose      SENSATION: WFL  POSTURE: Rounded shoulders decreased lumbar lordosis   UPPER EXTREMITY ROM:   Active ROM Right eval Left eval  Shoulder flexion WFL*   Shoulder extension    Shoulder abduction WFL*   Shoulder adduction WFL   Shoulder internal rotation [facility]   Shoulder external rotation 55 deg in supine 90 deg   Elbow flexion [facility] WFL  Elbow extension WFL WFL  (Blank rows = not tested)  UPPER EXTREMITY MMT:  MMT Right eval Left eval  Shoulder flexion 4 4+  Shoulder extension 4+ 4+  Shoulder abduction 4+ * 4+  Shoulder adduction 4+ 4+  Shoulder internal rotation 4 * 4  Shoulder external rotation 4- * 4  Middle trapezius    Lower trapezius    Elbow flexion 4+ 4+  Elbow extension 4+ 4+  (Blank rows = not tested)  *= pain   SHOULDER SPECIAL TESTS: Impingement tests: Neer impingement test: positive , Hawkins/Kennedy impingement test: positive , and Painful arc test: positive  SLAP lesions: Crank test: negative and Biceps load test: negative Instability tests: Load and shift test: negative and Apprehension test: negative Rotator cuff assessment: Drop arm test: negative, Empty can test: positive , and Hornblower's sign: positive  Biceps assessment: Yergason's test: negative  JOINT MOBILITY TESTING:  Grossly WFL.  Mildly reduced scapular mobility with flexion and abduction.   PALPATION:  Multiple TP noted in UT, supraspinatus, infraspinatus and teres major. Reports mild pain in UT  and infraspinatus in  TREATMENT DATE: 11/07/2023  Therex:  Seated shoulder ER with RTB x 15  UE ranger shoulder flexion to improve ROM and scapular rhythm. X 20 UE ranger into horz abduction x 15  Bil Shoulder ER/IR in pain free range with shoulder at 80-90 deg abduction  x 15 each  Chest press plus with PVC pipe x 15 4# AW on pipe   Shoulder flexion with PVC Pipe x15  Shoulder ER with scap setting x 12  RTB  Shoulder flexion to ~80 deg 1# DB Shoulder ER in sidelying x 15 AROM Shoulder IR in sidelying x 15 YTB  Seated bicep curl RTB x 15 Bil  Seated tricep extension x 12 bil RTB   Manual:  Shoulder ER/IR at 90 deg PROM with overpressure from PT x 10 bil with 6 sec hold each position. No pain   STM to supraspinatus 3 x 2 min with TP release. Extreme tenderness to origin of supraspinatus and central muscle belly. Noted 2 LTR with STM on this day.   Gentle IR with extension PROM 2 x 40 sec in pain free range  Ice applied for last 4 Therapeutic exersice to assist with Shoulder pain management.     PATIENT EDUCATION: Education details: POC. Benefits of PT treatment.  Pt educated throughout session about proper posture and technique with exercises. Improved exercise technique, movement at target joints, use of target muscles after min to mod verbal, visual, tactile cues.  Possible benefits for TDN.   Person educated: Patient Education method: Medical illustrator Education comprehension: verbalized understanding and returned demonstration  HOME EXERCISE PROGRAM:  Access Code: [email or web address] URL: [email or web address] Date: 10/21/2023 Prepared by: Massie Dollar  Exercises - Seated Scapular Retraction  - 1 x daily - 5 x weekly - 3 sets - 10 reps - 2 hold - Isometric Shoulder Extension at Wall  - 1 x daily - 5 x weekly - 3 sets - 10 reps - 2 hold - Isometric Shoulder Abduction at Wall  - 1 x daily - 5 x weekly - 3 sets - 10 reps - 2 hold - Seated Shoulder  Rolls  - 1 x daily - 5 x weekly - 3 sets - 10 reps - Seated Upper Trapezius Stretch  - 1 x daily - 5 x weekly - 3 sets - 4 reps - 20 hold - Shoulder External Rotation and Scapular Retraction  - 1 x daily - 5 x weekly - 3 sets - 10 reps - 2 hold  ASSESSMENT:  CLINICAL IMPRESSION: Patient is a 63 y.o. women who was seen today for physical therapy treatment for R shoulder pain. PT instructed pt in R shoulder therex for improved ROM and strength. Increased pain with STM on this day due to TP in supraspinatus; ice utilized for pain management for ending therex interventions. Pt reports decreased pain following cryotherapy.   Pt will benefit from skilled PT to address painful movements and strength deficits  to improve function, sleep and allow ability to perform all work duties without pain.    OBJECTIVE IMPAIRMENTS: decreased ROM, decreased strength, increased edema, impaired UE functional use, and pain.   ACTIVITY LIMITATIONS: carrying, lifting, sleeping, dressing, reach over head, hygiene/grooming, and caring for others  PARTICIPATION LIMITATIONS: meal prep, cleaning, laundry, shopping, community activity, and occupation  PERSONAL FACTORS: Behavior pattern, Fitness, Past/current experiences, and Profession are also affecting patient's functional outcome.   REHAB POTENTIAL: Good  CLINICAL DECISION MAKING: Stable/uncomplicated  EVALUATION COMPLEXITY: Low  GOALS: Goals reviewed with patient? Yes  SHORT TERM GOALS: Target date: 11/14/2023    Patient will be independent in home exercise program to improve strength/mobility for better functional independence with ADLs. Baseline: provided  Goal status: INITIAL   LONG TERM GOALS: Target date: 01/09/2024    Patient will increase quick Dash score by equal to or greater than 15points    to demonstrate statistically significant improvement in mobility and quality of life.  Baseline: 70.4 Goal status: INITIAL  2.  Patient (> 61 years old)  will reports no pain with hygiene and dressing tasks  Baseline: 8/10  Goal status: INITIAL  3.  Patient will increase R shoulder strength to grossly 4+/5 without pain  Baseline: 4 to 4+ with pain  Goal status: INITIAL  4.  Patient will be able to carry 10-15# box without pain to perform work functions  Baseline: pain with lifting  Goal status: INITIAL  5.  Patient will report sleeping through the night with no pain Baseline: waking every 2-4 hours due to shoulder pain. Goal status: INITIAL   PLAN:  PT FREQUENCY: 1-2x/week  PT DURATION: 12 weeks  PLANNED INTERVENTIONS: 97164- PT Re-evaluation, 97750- Physical Performance Testing, 97110-Therapeutic exercises, 97530- Therapeutic activity, [id number]- Neuromuscular re-education, 97535- Self Care, [id number]- Manual therapy, [id number]- Electrical stimulation (unattended), [phone]- Electrical stimulation (manual), Taping, Joint mobilization, Joint manipulation, Spinal manipulation, Spinal mobilization, Cryotherapy, Moist heat, and therapeutic  Dry needling.   PLAN FOR NEXT SESSION:  Therex for scapular mobility and strengthening.  Manual for TP release.  Possible TDN    Massie FORBES Dollar, PT 11/07/2023, 9:29 AM

## 2023-11-11 ENCOUNTER — Encounter: Admitting: Physical Therapy

## 2023-11-11 ENCOUNTER — Ambulatory Visit: Admitting: Physical Therapy

## 2023-11-11 DIAGNOSIS — M25511 Pain in right shoulder: Secondary | ICD-10-CM | POA: Diagnosis not present

## 2023-11-11 NOTE — Therapy (Signed)
 OUTPATIENT PHYSICAL THERAPY UPPER EXTREMITY treatment   Patient Name: Kayla Washington MRN: [id number] DOB:1960/12/11, 63 y.o., female Today's Date: 11/11/2023  END OF SESSION:  PT End of Session - 11/11/23 0803     Visit Number 8    Number of Visits 24    Date for PT Re-Evaluation 01/08/24    PT Start Time 0800    PT Stop Time 0840    PT Time Calculation (min) 40 min    Activity Tolerance Patient tolerated treatment well    Behavior During Therapy [facility] for tasks assessed/performed           Past Medical History:  Diagnosis Date   Anxiety    Depression    GERD (gastroesophageal reflux disease)    Past Surgical History:  Procedure Laterality Date   ABDOMINAL HYSTERECTOMY  05/2011   Still has left ovary;  Dr. Fleeta Milks   COLONOSCOPY  05/2014   COLONOSCOPY N/A 11/05/2023   Procedure: COLONOSCOPY;  Surgeon: Marinda Jayson KIDD, MD;  Location: [facility] ENDOSCOPY;  Service: General;  Laterality: N/A;   TUBAL LIGATION     Patient Active Problem List   Diagnosis Date Noted   Encounter for screening colonoscopy 11/05/2023   VAIN I (vaginal intraepithelial neoplasia grade I) 07/23/2017   S/P hysterectomy 07/11/2017   LGSIL on Pap smear of cervix 06/09/2017   Arthritis 11/04/2014   Anxiety 10/11/2014   HPV (human papilloma virus) infection 10/11/2014   Adaptation reaction 04/18/2008   Alcohol drinker 04/18/2006   Menopausal and perimenopausal disorder 04/18/2006    PCP: Joshua Cathryne BROCKS, MD   REFERRING PROVIDER:    Delinda Jinnie Jansky, CNM    REFERRING DIAG:  Diagnosis  M25.511 (ICD-10-CM) - Right shoulder pain, unspecified chronicity    THERAPY DIAG:  Right shoulder pain, unspecified chronicity  Rationale for Evaluation and Treatment: Rehabilitation  ONSET DATE: >1 month  SUBJECTIVE:                                                                                                                                                                                       SUBJECTIVE STATEMENT:   Pt reports that she is doing well. Had a good weekend. Went to an event at the [facility] and had a good time. Rested most of the day on Sunday.   No pain reported at the start of PT session   From Eval. Pt reports that she is having pain with IR with reach to pants, and to clasp Bra. States that Pain may have started in end of May to beginning June. Pain is the worst at night.   Hand dominance: Left   PAIN:  Are you having pain? Yes: NPRS scale: state that the pain was not as bad last night. States that pain is only 3/10 at start of PT treatment.   Pain location: R shoulder  Pain description: tight feeling with sharp pain with movement  Aggravating factors: sleep  Relieving factors: pain management. Ice. Feels better with movement throughout the day. Pain patch  PRECAUTIONS: None  RED FLAGS: None   WEIGHT BEARING RESTRICTIONS: No  FALLS:  Has patient fallen in last 6 months? Yes. 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   PLOF: Independent, Independent with basic ADLs, and Independent with gait  PATIENT GOALS: Get out of pain. Work and perform [employer] tasks without pain get a full night sleep.   NEXT MD VISIT: none at this time.   OBJECTIVE:  Note: Objective measures were completed at Evaluation unless otherwise noted.  DIAGNOSTIC FINDINGS:  None relevant     PATIENT SURVEYS :  Quick Dash:  QUICK DASH  Please rate your ability do the following activities in the last week by selecting the number below the appropriate response.  Quick Dash Disability/Symptom Score: [(sum of 45/ responses/11 (n)] x 25 = 70.4  Minimally Clinically Important Difference (MCID): 15-20  points  (Franchignoni, F. et al. (2013). Minimally clinically important difference of the disabilities of the arm, shoulder, and hand outcome measures (DASH) and its shortened version (Quick DASH). Journal of Orthopaedic & Sports Physical Therapy, 44(1), 30-39)   COGNITION: Overall cognitive status: Within functional limits for tasks assessed and disorganized and verbose      SENSATION: WFL  POSTURE: Rounded shoulders decreased lumbar lordosis   UPPER EXTREMITY ROM:   Active ROM Right eval Left eval  Shoulder flexion WFL*   Shoulder extension    Shoulder abduction WFL*   Shoulder adduction WFL   Shoulder internal rotation [facility]   Shoulder external rotation 55 deg in supine 90 deg   Elbow flexion [facility] WFL  Elbow extension WFL WFL  (Blank rows = not tested)  UPPER EXTREMITY MMT:  MMT Right eval Left eval  Shoulder flexion 4 4+  Shoulder extension 4+ 4+  Shoulder abduction 4+ * 4+  Shoulder adduction 4+ 4+  Shoulder internal rotation 4 * 4  Shoulder external rotation 4- * 4  Middle trapezius    Lower trapezius    Elbow flexion 4+ 4+  Elbow extension 4+ 4+  (Blank rows = not tested)  *= pain   SHOULDER SPECIAL TESTS: Impingement tests: Neer impingement test: positive , Hawkins/Kennedy impingement test: positive , and Painful arc test: positive  SLAP lesions: Crank test: negative and Biceps load test: negative Instability tests: Load and shift test: negative and Apprehension test: negative Rotator cuff assessment: Drop arm test: negative, Empty can test: positive , and Hornblower's sign: positive  Biceps assessment: Yergason's test: negative  JOINT MOBILITY TESTING:  Grossly WFL.  Mildly reduced scapular mobility with flexion and abduction.   PALPATION:  Multiple TP noted in UT, supraspinatus, infraspinatus and teres major. Reports mild pain in UT  and infraspinatus in  TREATMENT DATE: 11/11/2023   TA:  UBE activity tolerance training with functional movement patterns to improve shoulder and parascapular sequencing  2 min forward/2 min reverse. Min cues for decreased UT activation with backward peddling.   Shoulder ER with scap setting x 15 with eccentric control to improve LT activation with lifting.   Box lift and hold for 4 sec 2 x 6 with emphasis on reduced UT activation.  Box lift and place on table x 6 with cues for modified dead lift position and improved scapular setting.     Therex:  Prone shoulder extension palms up x 8 with reduced ROM and cues for improved scapular activation  Prone shoulder extension with palms down with cues for scapular activation.   Manual:  STM to levator/supraspinatus x 4 min total with cross frictional massage and TrP release as well as infraspinatus and teres major x 2 min each with TrP release  Ice applied for last 5 min of manual therapy for pain management.    PATIENT EDUCATION: Education details: POC. Benefits of PT treatment.  Pt educated throughout session about proper posture and technique with exercises. Improved exercise technique, movement at target joints, use of target muscles after min to mod verbal, visual, tactile cues.  Extensive education for Possible benefits and complications of TDN.   Person educated: Patient Education method: Medical illustrator Education comprehension: verbalized understanding and returned demonstration  HOME EXERCISE PROGRAM:  Access Code: [email or web address] URL: [email or web address] Date: 10/21/2023 Prepared by: Massie Dollar  Exercises - Seated Scapular Retraction  - 1 x daily - 5 x weekly - 3 sets - 10 reps - 2 hold - Isometric Shoulder Extension at Wall  - 1 x daily - 5 x weekly - 3 sets - 10 reps - 2 hold - Isometric Shoulder Abduction at Wall  - 1 x daily - 5 x weekly - 3 sets - 10 reps - 2 hold - Seated Shoulder  Rolls  - 1 x daily - 5 x weekly - 3 sets - 10 reps - Seated Upper Trapezius Stretch  - 1 x daily - 5 x weekly - 3 sets - 4 reps - 20 hold - Shoulder External Rotation and Scapular Retraction  - 1 x daily - 5 x weekly - 3 sets - 10 reps - 2 hold  ASSESSMENT:  CLINICAL IMPRESSION: Patient is a 63 y.o. women who was seen today for physical therapy treatment for R shoulder pain. PT treatment focused on functional movement patterns to reduced levator and UT activation with lifting tasks. Initially difficulty with lifting without UT activation but improved with instruction Education provided for benefits and complications for TDN with hand out provided for subsequent session.   Pt will benefit from skilled PT to address painful movements and strength deficits  to improve function, sleep and allow ability to perform all work duties without pain.    OBJECTIVE IMPAIRMENTS: decreased ROM, decreased strength, increased edema, impaired UE functional use, and pain.   ACTIVITY LIMITATIONS: carrying, lifting, sleeping, dressing, reach over head, hygiene/grooming, and caring for others  PARTICIPATION LIMITATIONS: meal prep, cleaning, laundry, shopping, community activity, and occupation  PERSONAL FACTORS: Behavior pattern, Fitness, Past/current experiences, and Profession are also affecting patient's functional outcome.   REHAB POTENTIAL: Good  CLINICAL DECISION MAKING: Stable/uncomplicated  EVALUATION COMPLEXITY: Low  GOALS: Goals reviewed with patient? Yes  SHORT TERM GOALS: Target date: 11/14/2023    Patient will be independent in home exercise program to improve strength/mobility for better functional independence with  ADLs. Baseline: provided  Goal status: INITIAL   LONG TERM GOALS: Target date: 01/09/2024    Patient will increase quick Dash score by equal to or greater than 15points    to demonstrate statistically significant improvement in mobility and quality of life.  Baseline:  70.4 Goal status: INITIAL  2.  Patient (> 32 years old) will reports no pain with hygiene and dressing tasks  Baseline: 8/10  Goal status: INITIAL  3.  Patient will increase R shoulder strength to grossly 4+/5 without pain  Baseline: 4 to 4+ with pain  Goal status: INITIAL  4.  Patient will be able to carry 10-15# box without pain to perform work functions  Baseline: pain with lifting  Goal status: INITIAL  5.  Patient will report sleeping through the night with no pain Baseline: waking every 2-4 hours due to shoulder pain. Goal status: INITIAL   PLAN:  PT FREQUENCY: 1-2x/week  PT DURATION: 12 weeks  PLANNED INTERVENTIONS: 97164- PT Re-evaluation, 97750- Physical Performance Testing, 97110-Therapeutic exercises, 97530- Therapeutic activity, [id number]- Neuromuscular re-education, 97535- Self Care, 02859- Manual therapy, [id number]- Electrical stimulation (unattended), [phone]- Electrical stimulation (manual), Taping, Joint mobilization, Joint manipulation, Spinal manipulation, Spinal mobilization, Cryotherapy, Moist heat, and therapeutic  Dry needling.   PLAN FOR NEXT SESSION:  Therex for scapular mobility and strengthening.  Manual for TP release.  Possible TDN    Massie FORBES Dollar, PT 11/11/2023, 8:04 AM

## 2023-11-13 ENCOUNTER — Encounter: Admitting: Physical Therapy

## 2023-11-14 ENCOUNTER — Ambulatory Visit: Admitting: Physical Therapy

## 2023-11-14 DIAGNOSIS — M25511 Pain in right shoulder: Secondary | ICD-10-CM | POA: Diagnosis not present

## 2023-11-14 NOTE — Therapy (Signed)
 OUTPATIENT PHYSICAL THERAPY UPPER EXTREMITY treatment   Patient Name: Kayla Washington MRN: [id number] DOB:[birth year], 63 y.o., female Today's Date: 11/14/2023  END OF SESSION:  PT End of Session - 11/14/23 0943     Visit Number 9    Number of Visits 24    Date for PT Re-Evaluation 01/08/24    PT Start Time 0937    PT Stop Time 1015    PT Time Calculation (min) 38 min    Activity Tolerance Patient tolerated treatment well    Behavior During Therapy [facility] for tasks assessed/performed           Past Medical History:  Diagnosis Date   Anxiety    Depression    GERD (gastroesophageal reflux disease)    Past Surgical History:  Procedure Laterality Date   ABDOMINAL HYSTERECTOMY  05/2011   Still has left ovary;  Dr. Fleeta Milks   COLONOSCOPY  05/2014   COLONOSCOPY N/A 11/05/2023   Procedure: COLONOSCOPY;  Surgeon: Marinda Jayson KIDD, MD;  Location: [facility] ENDOSCOPY;  Service: General;  Laterality: N/A;   TUBAL LIGATION     Patient Active Problem List   Diagnosis Date Noted   Encounter for screening colonoscopy 11/05/2023   VAIN I (vaginal intraepithelial neoplasia grade I) 07/23/2017   S/P hysterectomy 07/11/2017   LGSIL on Pap smear of cervix 06/09/2017   Arthritis 11/04/2014   Anxiety 10/11/2014   HPV (human papilloma virus) infection 10/11/2014   Adaptation reaction 04/18/2008   Alcohol drinker 04/18/2006   Menopausal and perimenopausal disorder 04/18/2006    PCP: Joshua Cathryne BROCKS, MD   REFERRING PROVIDER:    Delinda Jinnie Jansky, CNM    REFERRING DIAG:  Diagnosis  M25.511 (ICD-10-CM) - Right shoulder pain, unspecified chronicity    THERAPY DIAG:  Right shoulder pain, unspecified chronicity  Rationale for Evaluation and Treatment: Rehabilitation  ONSET DATE: >1 month  SUBJECTIVE:                                                                                                                                                                                       SUBJECTIVE STATEMENT:   Pt reports that she is doing well. Mowed yard yesterday, so her shoulder was hurting last night. Took some pain medication, and iced shoulder which resolved pain overnight.   No pain reported at the start of PT session   From Eval. Pt reports that she is having pain with IR with reach to pants, and to clasp Bra. States that Pain may have started in end of May to beginning June. Pain is the worst at night.   Hand dominance: Left   PAIN:  Are  you having pain? Yes: NPRS scale: state that the pain was not as bad last night. States that pain is only 3/10 at start of PT treatment.   Pain location: R shoulder  Pain description: tight feeling with sharp pain with movement  Aggravating factors: sleep  Relieving factors: pain management. Ice. Feels better with movement throughout the day. Pain patch  PRECAUTIONS: None  RED FLAGS: None   WEIGHT BEARING RESTRICTIONS: No  FALLS:  Has patient fallen in last 6 months? Yes. 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   PLOF: Independent, Independent with basic ADLs, and Independent with gait  PATIENT GOALS: Get out of pain. Work and perform [employer] tasks without pain get a full night sleep.   NEXT MD VISIT: none at this time.   OBJECTIVE:  Note: Objective measures were completed at Evaluation unless otherwise noted.  DIAGNOSTIC FINDINGS:  None relevant     PATIENT SURVEYS :  Quick Dash:  QUICK DASH  Please rate your ability do the following activities in the last week by selecting the number below the appropriate response.  Quick Dash Disability/Symptom Score: [(sum of 45/ responses/11 (n)] x 25 = 70.4  Minimally Clinically Important Difference  (MCID): 15-20 points  (Franchignoni, F. et al. (2013). Minimally clinically important difference of the disabilities of the arm, shoulder, and hand outcome measures (DASH) and its shortened version (Quick DASH). Journal of Orthopaedic & Sports Physical Therapy, 44(1), 30-39)   COGNITION: Overall cognitive status: Within functional limits for tasks assessed and disorganized and verbose      SENSATION: WFL  POSTURE: Rounded shoulders decreased lumbar lordosis   UPPER EXTREMITY ROM:   Active ROM Right eval Left eval  Shoulder flexion WFL*   Shoulder extension    Shoulder abduction WFL*   Shoulder adduction WFL   Shoulder internal rotation [facility]   Shoulder external rotation 55 deg in supine 90 deg   Elbow flexion [facility] WFL  Elbow extension WFL WFL  (Blank rows = not tested)  UPPER EXTREMITY MMT:  MMT Right eval Left eval  Shoulder flexion 4 4+  Shoulder extension 4+ 4+  Shoulder abduction 4+ * 4+  Shoulder adduction 4+ 4+  Shoulder internal rotation 4 * 4  Shoulder external rotation 4- * 4  Middle trapezius    Lower trapezius    Elbow flexion 4+ 4+  Elbow extension 4+ 4+  (Blank rows = not tested)  *= pain   SHOULDER SPECIAL TESTS: Impingement tests: Neer impingement test: positive , Hawkins/Kennedy impingement test: positive , and Painful arc test: positive  SLAP lesions: Crank test: negative and Biceps load test: negative Instability tests: Load and shift test: negative and Apprehension test: negative Rotator cuff assessment: Drop arm test: negative, Empty can test: positive , and Hornblower's sign: positive  Biceps assessment: Yergason's test: negative  JOINT MOBILITY TESTING:  Grossly WFL.  Mildly reduced scapular mobility with flexion and abduction.   PALPATION:  Multiple TP noted in UT, supraspinatus, infraspinatus and teres major. Reports mild pain in UT  and infraspinatus in  TREATMENT DATE: 11/14/2023   TA:  UBE activity tolerance training with functional movement patterns to improve shoulder and parascapular sequencing 2 min forward/2 min reverse. Improved scapular rhythm   Supine Shoulder ER with scap setting x 12 with eccentric control to improve LT activation with lifting.  isometric abduction with shoulder flexion using pipe x 10   Trigger Point Dry Needling  Initial Treatment: Pt instructed on Dry Needling rational, procedures, and possible side effects. Pt instructed to expect mild to moderate muscle soreness later in the day and/or into the next day.  Pt instructed in methods to reduce muscle soreness. Pt instructed to continue prescribed HEP. Because Dry Needling was performed over or adjacent to a lung field, pt was educated on S/S of pneumothorax and to seek immediate medical attention should they occur.  Patient was educated on signs and symptoms of infection and other risk factors and advised to seek medical attention should they occur.  Patient verbalized understanding of these instructions and education.   Patient Verbal Consent Given: Yes Education Handout Provided: Yes Muscles Treated: UT/Levator  Electrical Stimulation Performed: No Treatment Response/Outcome: LTR . No pain reported.    Therex:   Prone shoulder extension palms down with tactile cues for Lower trap activation 2 x 10 . No pain reported.  Isometric abduction x 10  Scapular push press with pvc pipe x 12   Seated UT stretch 2 x 20 sec bil with cues for decreased  UT activation to reach end range   PATIENT EDUCATION: Education details: POC. Benefits of PT treatment.  Pt educated throughout session about proper posture and technique with exercises. Improved exercise technique, movement at target joints, use of target muscles after min to mod verbal, visual, tactile cues.  Extensive education for Possible benefits and  complications of TDN.   Person educated: Patient Education method: Medical illustrator Education comprehension: verbalized understanding and returned demonstration  HOME EXERCISE PROGRAM:  Access Code: [email or web address] URL: [email or web address] Date: 10/21/2023 Prepared by: Massie Dollar  Exercises - Seated Scapular Retraction  - 1 x daily - 5 x weekly - 3 sets - 10 reps - 2 hold - Isometric Shoulder Extension at Wall  - 1 x daily - 5 x weekly - 3 sets - 10 reps - 2 hold - Isometric Shoulder Abduction at Wall  - 1 x daily - 5 x weekly - 3 sets - 10 reps - 2 hold - Seated Shoulder Rolls  - 1 x daily - 5 x weekly - 3 sets - 10 reps - Seated Upper Trapezius Stretch  - 1 x daily - 5 x weekly - 3 sets - 4 reps - 20 hold - Shoulder External Rotation and Scapular Retraction  - 1 x daily - 5 x weekly - 3 sets - 10 reps - 2 hold  ASSESSMENT:  CLINICAL IMPRESSION: Patient is a 63 y.o. women who was seen today for physical therapy treatment for R shoulder pain. PT treatment focused on functional movement patterns as strengthening. TDN performed to UT/levator on the R side for TP management. At end of session. Is noted to have improved IR with reduced pain to reach bra strap region.   Pt will benefit from skilled PT to address painful movements and strength deficits  to improve function, sleep and allow ability to perform all work duties without pain.    OBJECTIVE IMPAIRMENTS: decreased ROM, decreased strength, increased edema, impaired UE functional use, and pain.   ACTIVITY LIMITATIONS: carrying, lifting, sleeping, dressing, reach over  head, hygiene/grooming, and caring for others  PARTICIPATION LIMITATIONS: meal prep, cleaning, laundry, shopping, community activity, and occupation  PERSONAL FACTORS: Behavior pattern, Fitness, Past/current experiences, and Profession are also affecting patient's functional outcome.   REHAB POTENTIAL: Good  CLINICAL DECISION MAKING:  Stable/uncomplicated  EVALUATION COMPLEXITY: Low  GOALS: Goals reviewed with patient? Yes  SHORT TERM GOALS: Target date: 11/14/2023    Patient will be independent in home exercise program to improve strength/mobility for better functional independence with ADLs. Baseline: provided  Goal status: INITIAL   LONG TERM GOALS: Target date: 01/09/2024    Patient will increase quick Dash score by equal to or greater than 15points    to demonstrate statistically significant improvement in mobility and quality of life.  Baseline: 70.4 Goal status: INITIAL  2.  Patient (> 39 years old) will reports no pain with hygiene and dressing tasks  Baseline: 8/10  Goal status: INITIAL  3.  Patient will increase R shoulder strength to grossly 4+/5 without pain  Baseline: 4 to 4+ with pain  Goal status: INITIAL  4.  Patient will be able to carry 10-15# box without pain to perform work functions  Baseline: pain with lifting  Goal status: INITIAL  5.  Patient will report sleeping through the night with no pain Baseline: waking every 2-4 hours due to shoulder pain. Goal status: INITIAL   PLAN:  PT FREQUENCY: 1-2x/week  PT DURATION: 12 weeks  PLANNED INTERVENTIONS: 97164- PT Re-evaluation, 97750- Physical Performance Testing, 97110-Therapeutic exercises, 97530- Therapeutic activity, [id number]- Neuromuscular re-education, 97535- Self Care, [id number]- Manual therapy, [id number]- Electrical stimulation (unattended), [phone]- Electrical stimulation (manual), Taping, Joint mobilization, Joint manipulation, Spinal manipulation, Spinal mobilization, Cryotherapy, Moist heat, and therapeutic  Dry needling.   PLAN FOR NEXT SESSION:   Continued Therex for scapular mobility and strengthening.  Manual for TP release.  Possible TDN    Massie FORBES Dollar, PT 11/14/2023, 10:00 AM

## 2023-11-18 ENCOUNTER — Ambulatory Visit: Admitting: Physical Therapy

## 2023-11-18 ENCOUNTER — Encounter: Admitting: Physical Therapy

## 2023-11-19 ENCOUNTER — Encounter: Payer: Self-pay | Admitting: Student

## 2023-11-19 ENCOUNTER — Ambulatory Visit: Admitting: Student

## 2023-11-19 VITALS — BP 122/76 | HR 81 | Ht 66.0 in | Wt 187.4 lb

## 2023-11-19 DIAGNOSIS — E6609 Other obesity due to excess calories: Secondary | ICD-10-CM

## 2023-11-19 DIAGNOSIS — F32A Depression, unspecified: Secondary | ICD-10-CM

## 2023-11-19 DIAGNOSIS — E785 Hyperlipidemia, unspecified: Secondary | ICD-10-CM

## 2023-11-19 DIAGNOSIS — K219 Gastro-esophageal reflux disease without esophagitis: Secondary | ICD-10-CM | POA: Diagnosis not present

## 2023-11-19 DIAGNOSIS — Z6830 Body mass index (BMI) 30.0-30.9, adult: Secondary | ICD-10-CM

## 2023-11-19 DIAGNOSIS — F419 Anxiety disorder, unspecified: Secondary | ICD-10-CM

## 2023-11-19 DIAGNOSIS — K2100 Gastro-esophageal reflux disease with esophagitis, without bleeding: Secondary | ICD-10-CM | POA: Diagnosis not present

## 2023-11-19 DIAGNOSIS — K5790 Diverticulosis of intestine, part unspecified, without perforation or abscess without bleeding: Secondary | ICD-10-CM

## 2023-11-19 DIAGNOSIS — E66811 Obesity, class 1: Secondary | ICD-10-CM

## 2023-11-19 DIAGNOSIS — Z789 Other specified health status: Secondary | ICD-10-CM | POA: Diagnosis not present

## 2023-11-19 DIAGNOSIS — R87610 Atypical squamous cells of undetermined significance on cytologic smear of cervix (ASC-US): Secondary | ICD-10-CM

## 2023-11-19 DIAGNOSIS — E559 Vitamin D deficiency, unspecified: Secondary | ICD-10-CM

## 2023-11-19 MED ORDER — FLUOXETINE HCL 10 MG PO TABS: ORAL_TABLET | ORAL | 1 refills | Status: AC

## 2023-11-19 MED ORDER — FLUOXETINE HCL 20 MG PO CAPS: 20.0000 mg | ORAL_CAPSULE | Freq: Every day | ORAL | 1 refills | Status: AC

## 2023-11-19 NOTE — Assessment & Plan Note (Signed)
 Stopped due to acid reflux  6pack during the week 1-2 a week

## 2023-11-19 NOTE — Assessment & Plan Note (Signed)
 Well controlled on protonix , sometimes uses omeprazole over the counter. Takes as needed. This issue is much improved with decrease alcohol intake.

## 2023-11-19 NOTE — Progress Notes (Unsigned)
 Established Patient Office Visit  Subjective   Patient ID: Kayla Washington, female    DOB: 1960-10-09  Age: 63 y.o. MRN: [id number]  Chief Complaint  Patient presents with   Gastroesophageal Reflux    6 month f/up    Kayla Washington with medical hx listed below presents today for transfer of care. Previously seeing Dr. Joshua who has retired. She is feeling well today without acute complaints. Please refer to problem based charting for further details and assessment and plan of current problem and chronic medical conditions.   Patient Active Problem List   Diagnosis Date Noted   ASCUS of cervix with negative high risk HPV 11/20/2023   Obesity 11/20/2023   Hyperlipidemia 11/20/2023   Vitamin D  deficiency 11/20/2023   Diverticulosis 11/20/2023   GERD (gastroesophageal reflux disease) 11/19/2023   Encounter for screening colonoscopy 11/05/2023   S/P hysterectomy 07/11/2017   VAIN I (vaginal intraepithelial neoplasia grade I) 06/09/2017   Anxiety and depression 10/11/2014   Alcohol drinker 04/18/2006   Menopausal and perimenopausal disorder 04/18/2006      ROS Refer to HPI    Objective:     Outpatient Encounter Medications as of 11/19/2023  Medication Sig   aspirin EC 81 MG tablet Take 81 mg by mouth daily. Swallow whole.   cholecalciferol (VITAMIN D3) 25 MCG (1000 UNIT) tablet Take 1,000 Units by mouth daily.   omeprazole (PRILOSEC OTC) 20 MG tablet Take 20 mg by mouth as needed.   pantoprazole  (PROTONIX ) 40 MG tablet Take 1 tablet (40 mg total) by mouth daily.   Psyllium (METAMUCIL MULTIHEALTH FIBER PO) Take by mouth as needed.   [DISCONTINUED] FLUoxetine  (PROZAC ) 10 MG tablet To take q day   [DISCONTINUED] FLUoxetine  (PROZAC ) 20 MG capsule Take 1 capsule (20 mg total) by mouth daily.   [DISCONTINUED] ibuprofen  (ADVIL ) 600 MG tablet Take 1 tablet (600 mg total) by mouth every 6 (six) hours as needed.   FLUoxetine  (PROZAC ) 10 MG tablet To take q day   FLUoxetine   (PROZAC ) 20 MG capsule Take 1 capsule (20 mg total) by mouth daily.   Na Sulfate-K Sulfate-Mg Sulfate concentrate (SUPREP) 17.5-3.13-1.6 GM/177ML SOLN Take 1 kit by mouth once. (Patient not taking: Reported on 11/19/2023)   [DISCONTINUED] cimetidine (TAGAMET) 200 MG tablet Take 200 mg by mouth 2 (two) times daily. (Patient not taking: Reported on 11/19/2023)   No facility-administered encounter medications on file as of 11/19/2023.    BP 122/76   Pulse 81   Ht 5' 6 (1.676 m)   Wt 187 lb 6 oz (85 kg)   SpO2 96%   BMI 30.24 kg/m  BP Readings from Last 3 Encounters:  11/19/23 122/76  11/05/23 121/69  10/28/23 (!) 144/84    Physical Exam Constitutional:      Appearance: Normal appearance.  HENT:     Head: Normocephalic and atraumatic.     Mouth/Throat:     Mouth: Mucous membranes are moist.     Pharynx: Oropharynx is clear.  Cardiovascular:     Rate and Rhythm: Normal rate and regular rhythm.  Pulmonary:     Effort: Pulmonary effort is normal.     Breath sounds: No rhonchi or rales.  Abdominal:     General: Abdomen is flat. Bowel sounds are normal. There is no distension.     Palpations: Abdomen is soft.     Tenderness: There is no abdominal tenderness.  Musculoskeletal:        General: Normal range of motion.  Right lower leg: No edema.     Left lower leg: No edema.  Skin:    General: Skin is warm and dry.     Capillary Refill: Capillary refill takes less than 2 seconds.  Neurological:     General: No focal deficit present.     Mental Status: She is alert and oriented to person, place, and time.  Psychiatric:        Mood and Affect: Mood normal.        Behavior: Behavior normal.        11/19/2023    2:22 PM 08/14/2023    2:43 PM 05/29/2023    2:02 PM  Depression screen PHQ 2/9  Decreased Interest 0 3 0  Down, Depressed, Hopeless 0 0 0  PHQ - 2 Score 0 3 0  Altered sleeping 0 2 0  Tired, decreased energy 0 3 0  Change in appetite 0 2 0  Feeling bad or  failure about yourself  0 1 0  Trouble concentrating 0 2 0  Moving slowly or fidgety/restless 0 0 0  Suicidal thoughts 0 0 0  PHQ-9 Score 0 13 0  Difficult doing work/chores Not difficult at all Somewhat difficult Not difficult at all       11/19/2023    2:22 PM 08/14/2023    2:44 PM 05/29/2023    2:03 PM 05/01/2023    3:18 PM  GAD 7 : Generalized Anxiety Score  Nervous, Anxious, on Edge 0 2 0 1  Control/stop worrying 0 1 0 1  Worry too much - different things 0 1 0 1  Trouble relaxing 0 1 0 0  Restless 0 1 0 0  Easily annoyed or irritable 0 2 0 2  Afraid - awful might happen 0 1 0 0  Total GAD 7 Score 0 9 0 5  Anxiety Difficulty Not difficult at all Somewhat difficult Not difficult at all Not difficult at all    No results found for any visits on 11/19/23.  Last CBC Lab Results  Component Value Date   WBC 6.6 09/29/2023   HGB 12.3 09/29/2023   HCT 38.5 09/29/2023   MCV 84 09/29/2023   MCH 27.0 09/29/2023   RDW 14.9 09/29/2023   PLT 332 09/29/2023   Last metabolic panel Lab Results  Component Value Date   GLUCOSE 103 (H) 09/29/2023   NA 140 09/29/2023   K 5.0 09/29/2023   CL 102 09/29/2023   CO2 23 09/29/2023   BUN 13 09/29/2023   CREATININE 0.70 09/29/2023   EGFR 97 09/29/2023   CALCIUM 10.0 09/29/2023   PROT 6.8 09/29/2023   ALBUMIN 4.3 09/29/2023   LABGLOB 2.5 09/29/2023   AGRATIO 1.8 01/17/2022   BILITOT 0.3 09/29/2023   ALKPHOS 135 (H) 09/29/2023   AST 31 09/29/2023   ALT 40 (H) 09/29/2023   ANIONGAP 9 06/14/2017      The 10-year ASCVD risk score (Arnett DK, et al., 2019) is: 4.5%    Assessment & Plan:  Anxiety and depression Assessment & Plan: Well controlled currently on lexapro 30 mg daily. PHQ9 is 0 today. Refilled today, continue to monitor mood.   Orders: -     FLUoxetine  HCl; To take q day  Dispense: 90 tablet; Refill: 1 -     FLUoxetine  HCl; Take 1 capsule (20 mg total) by mouth daily.  Dispense: 90 capsule; Refill:  1  Gastroesophageal reflux disease, unspecified whether esophagitis present Assessment & Plan: Well controlled on protonix , sometimes  uses omeprazole over the counter. Takes as needed. This issue is much improved with decrease alcohol intake.    Gastroesophageal reflux disease with esophagitis without hemorrhage Assessment & Plan: Well controlled on protonix , sometimes uses omeprazole over the counter. Takes as needed. This issue is much improved with decrease alcohol intake.    Alcohol drinker Assessment & Plan: Has reduced alcohol due to acid reflux, was drink 6 or more drinks 1-2 times weekly, now drinking 4-6 drinks once every 1-2 weeks. No history of withdrawals or cravings. Some elevation ALT and alk phos with normal AST and platelets on recent labs on 09/29/2023.  Discussed reducing number of drinks when she does go out to 2 or less, she is agreeable to trying this. Repeat CBC and CMP at next visit.   ASCUS of cervix with negative high risk HPV Assessment & Plan: History of LOW-GRADE SQUAMOUS INTRAEPITHELIAL LESION (LSIL, VAIN 1) in 2019. Most recent PAP with ASCUS, HPV-, plan for repeat PAP next year with GYN.   Class 1 obesity due to excess calories without serious comorbidity with body mass index (BMI) of 30.0 to 30.9 in adult Assessment & Plan: Discussed increasing physical activity ash she has been sedentary since she is off for summer, works in [employer]. Will be more active once work starts. Discussed healthy diet reducing alcohol intake.      Hyperlipidemia, unspecified hyperlipidemia type Assessment & Plan: Lipid Panel     Component Value Date/Time   CHOL 234 (H) 09/29/2023 0944   TRIG 94 09/29/2023 0944   HDL 57 09/29/2023 0944   CHOLHDL 4.1 09/29/2023 0944   LDLCALC 160 (H) 09/29/2023 0944   LABVLDL 17 09/29/2023 0944   The 10-year ASCVD risk score (Arnett DK, et al., 2019) is: 4.5%, low risk encouraged dietary and lifestyle modification including  reducing alcohol intake. Repeat lipid panel in 1 year   Vitamin D  deficiency Assessment & Plan: Vitamin D  27.3 on 09/29/2023 and recently started D3 1000 units daily for the past 2 months. Will repeat vitamin D  at next visit to assess for improvement.    Diverticulosis Assessment & Plan: Noted on colonoscopy on 10/2023, denies hx of diverticulitis. Currently on daily fiber supplement.       Return in about 6 months (around 05/21/2024) for de[pression, mood.    Harlene Saddler, MD

## 2023-11-20 ENCOUNTER — Ambulatory Visit: Admitting: Physical Therapy

## 2023-11-20 ENCOUNTER — Encounter: Admitting: Physical Therapy

## 2023-11-20 DIAGNOSIS — E785 Hyperlipidemia, unspecified: Secondary | ICD-10-CM | POA: Insufficient documentation

## 2023-11-20 DIAGNOSIS — E669 Obesity, unspecified: Secondary | ICD-10-CM | POA: Insufficient documentation

## 2023-11-20 DIAGNOSIS — M25511 Pain in right shoulder: Secondary | ICD-10-CM

## 2023-11-20 DIAGNOSIS — K5790 Diverticulosis of intestine, part unspecified, without perforation or abscess without bleeding: Secondary | ICD-10-CM | POA: Insufficient documentation

## 2023-11-20 DIAGNOSIS — E559 Vitamin D deficiency, unspecified: Secondary | ICD-10-CM | POA: Insufficient documentation

## 2023-11-20 DIAGNOSIS — R87610 Atypical squamous cells of undetermined significance on cytologic smear of cervix (ASC-US): Secondary | ICD-10-CM | POA: Insufficient documentation

## 2023-11-20 NOTE — Assessment & Plan Note (Signed)
 Noted on colonoscopy on 10/2023, denies hx of diverticulitis. Currently on daily fiber supplement.

## 2023-11-20 NOTE — Assessment & Plan Note (Signed)
 Well controlled currently on lexapro 30 mg daily. PHQ9 is 0 today. Refilled today, continue to monitor mood.

## 2023-11-20 NOTE — Therapy (Signed)
 OUTPATIENT PHYSICAL THERAPY UPPER EXTREMITY treatment/ PHYSICAL THERAPY PROGRESS NOTE   Dates of reporting period  10/16/23   to   11/20/2023   Patient Name: Kayla Washington MRN: [id number] DOB:1960/05/16, 63 y.o., female Today's Date: 11/20/2023  END OF SESSION:  PT End of Session - 11/20/23 0808     Visit Number 10    Number of Visits 24    Date for PT Re-Evaluation 01/08/24    PT Start Time 0805    PT Stop Time 0845    PT Time Calculation (min) 40 min    Activity Tolerance Patient tolerated treatment well    Behavior During Therapy [facility] for tasks assessed/performed           Past Medical History:  Diagnosis Date   Anxiety    Depression    GERD (gastroesophageal reflux disease)    Past Surgical History:  Procedure Laterality Date   ABDOMINAL HYSTERECTOMY  05/2011   Still has left ovary;  Dr. Fleeta Milks   COLONOSCOPY  05/2014   COLONOSCOPY N/A 11/05/2023   Procedure: COLONOSCOPY;  Surgeon: Marinda Jayson KIDD, MD;  Location: [facility] ENDOSCOPY;  Service: General;  Laterality: N/A;   TUBAL LIGATION     Patient Active Problem List   Diagnosis Date Noted   GERD (gastroesophageal reflux disease) 11/19/2023   Encounter for screening colonoscopy 11/05/2023   VAIN I (vaginal intraepithelial neoplasia grade I) 07/23/2017   S/P hysterectomy 07/11/2017   LGSIL on Pap smear of cervix 06/09/2017   Arthritis 11/04/2014   Anxiety 10/11/2014   HPV (human papilloma virus) infection 10/11/2014   Adaptation reaction 04/18/2008   Alcohol drinker 04/18/2006   Menopausal and perimenopausal disorder 04/18/2006    PCP: Joshua Cathryne BROCKS, MD   REFERRING PROVIDER:    Delinda Jinnie Jansky, CNM    REFERRING DIAG:  Diagnosis  M25.511 (ICD-10-CM) - Right shoulder pain, unspecified chronicity    THERAPY DIAG:  Right shoulder pain, unspecified chronicity  Rationale for Evaluation and Treatment: Rehabilitation  ONSET DATE: >1 month  SUBJECTIVE:                                                                                                                                                                                       SUBJECTIVE STATEMENT:   Pt reports that she is doing well. Reports that she had no pain Sunday or Monday following TDN. Returned to work on Tuesday for class to prepare for start of school year, and had some pain that night. No pain reported at the start of PT session   From Eval. Pt reports that she is having pain with IR with reach to pants,  and to clasp Bra. States that Pain may have started in end of May to beginning June. Pain is the worst at night.   Hand dominance: Left   PAIN:  Are you having pain? Yes: NPRS scale: state that the pain was not as bad last night. States that pain is only 3/10 at start of PT treatment.   Pain location: R shoulder  Pain description: tight feeling with sharp pain with movement  Aggravating factors: sleep  Relieving factors: pain management. Ice. Feels better with movement throughout the day. Pain patch  PRECAUTIONS: None  RED FLAGS: None   WEIGHT BEARING RESTRICTIONS: No  FALLS:  Has patient fallen in last 6 months? Yes. 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   PLOF: Independent, Independent with basic ADLs, and Independent with gait  PATIENT GOALS: Get out of pain. Work and perform [employer] tasks without pain get a full night sleep.   NEXT MD VISIT: none at this time.   OBJECTIVE:  Note: Objective measures were completed at Evaluation unless otherwise noted.  DIAGNOSTIC FINDINGS:  None relevant     PATIENT SURVEYS :  Quick Dash:  QUICK DASH  Please rate your ability do the following activities in the last week by selecting the  number below the appropriate response.  Quick Dash Disability/Symptom Score: [(sum of 45/ responses/11 (n)] x 25 = 70.4  Minimally Clinically Important Difference (MCID): 15-20 points  (Franchignoni, F. et al. (2013). Minimally clinically important difference of the disabilities of the arm, shoulder, and hand outcome measures (DASH) and its shortened version (Quick DASH). Journal of Orthopaedic & Sports Physical Therapy, 44(1), 30-39)   COGNITION: Overall cognitive status: Within functional limits for tasks assessed and disorganized and verbose      SENSATION: WFL  POSTURE: Rounded shoulders decreased lumbar lordosis   UPPER EXTREMITY ROM:   Active ROM Right eval Left eval  Shoulder flexion WFL*   Shoulder extension    Shoulder abduction WFL*   Shoulder adduction WFL   Shoulder internal rotation [facility]   Shoulder external rotation 55 deg in supine 90 deg   Elbow flexion [facility] WFL  Elbow extension WFL WFL  (Blank rows = not tested)  UPPER EXTREMITY MMT:  MMT Right eval Left eval R 8/21 L 8/21  Shoulder flexion 4 4+ 4 4+  Shoulder extension 4+ 4+ 4+ 4+  Shoulder abduction 4+ * 4+ 4+* 4+  Shoulder adduction 4+ 4+ 4+ 4+  Shoulder internal rotation 4 * 4 4+ 4  Shoulder external rotation 4- * 4 4* 4  Middle trapezius      Lower trapezius      Elbow flexion 4+ 4+ 4+ 4+  Elbow extension 4+ 4+ 4+ 4+  (Blank rows = not tested)  *= pain   SHOULDER SPECIAL TESTS: Impingement tests: Neer impingement test: positive , Hawkins/Kennedy impingement test: positive , and Painful arc test: positive  SLAP lesions: Crank test: negative and Biceps load test: negative Instability tests: Load and shift test: negative and Apprehension test: negative Rotator cuff assessment: Drop arm test: negative, Empty can test: positive , and Hornblower's sign: positive  Biceps assessment: Yergason's test: negative  JOINT MOBILITY TESTING:  Grossly WFL.  Mildly reduced scapular mobility with flexion  and  abduction.   PALPATION:  Multiple TP noted in UT, supraspinatus, infraspinatus and teres major. Reports mild pain in UT  and infraspinatus in                                                                                                                              TREATMENT DATE: 11/20/2023   TA:  UBE activity tolerance training with functional movement patterns to improve shoulder and parascapular sequencing 2 min forward/2 min reverse. Improved scapular rhythm   Strength assessment as listed above.   Completed Quick dash(untimed)  15# box carry through rehab gym  Supine Shoulder ER with scap setting x 12 with eccentric control to improve LT activation with lifting.  isometric abduction with shoulder flexion using pipe x 10  Therex:  HEP expansion Access Code: [email or web address] URL: [email or web address] Date: 11/20/2023 Prepared by: Massie Dollar  Exercises - Standing Bilateral Low Shoulder Row with Anchored Resistance  - 1 x daily - 5 x weekly - 3 sets - 10 reps - 2sec hold - Shoulder extension with resistance - Neutral  - 1 x daily - 5 x weekly - 3 sets - 10 reps - 2 sec hold - Shoulder External Rotation and Scapular Retraction with Resistance  - 1 x daily - 5 x weekly - 3 sets - 10 reps - 2sec hold - Seated Single Arm Shoulder Flexion  - 1 x daily - 5 x weekly - 3 sets - 10 reps  Continued education for improved scapular rhythm to reduce pain with lifting and posterior reaching to reduce affects of impingement syndrome.   PATIENT EDUCATION: Education details: POC. Benefits of PT treatment.  Pt educated throughout session about proper posture and technique with exercises. Improved exercise technique, movement at target joints, use of target muscles after min to mod verbal, visual, tactile cues.  Extensive education for Possible benefits and complications of TDN.   Person educated: Patient Education method: Medical illustrator Education comprehension:  verbalized understanding and returned demonstration  HOME EXERCISE PROGRAM:  Access Code: [email or web address] URL: [email or web address] Date: 10/21/2023 Prepared by: Massie Dollar  Exercises - Seated Scapular Retraction  - 1 x daily - 5 x weekly - 3 sets - 10 reps - 2 hold - Isometric Shoulder Extension at Wall  - 1 x daily - 5 x weekly - 3 sets - 10 reps - 2 hold - Isometric Shoulder Abduction at Wall  - 1 x daily - 5 x weekly - 3 sets - 10 reps - 2 hold - Seated Shoulder Rolls  - 1 x daily - 5 x weekly - 3 sets - 10 reps - Seated Upper Trapezius Stretch  - 1 x daily - 5 x weekly - 3 sets - 4 reps - 20 hold - Shoulder External Rotation and Scapular Retraction  - 1 x daily - 5 x weekly - 3 sets - 10 reps - 2 hold  Access Code: [email or web address] URL: [email or web address] Date: 11/20/2023 Prepared by:  Massie Dollar  Exercises - Standing Bilateral Low Shoulder Row with Anchored Resistance  - 1 x daily - 5 x weekly - 3 sets - 10 reps - 2sec hold - Shoulder extension with resistance - Neutral  - 1 x daily - 5 x weekly - 3 sets - 10 reps - 2 sec hold - Shoulder External Rotation and Scapular Retraction with Resistance  - 1 x daily - 5 x weekly - 3 sets - 10 reps - 2sec hold - Seated Single Arm Shoulder Flexion  - 1 x daily - 5 x weekly - 3 sets - 10 reps  ASSESSMENT:  CLINICAL IMPRESSION: Patient is a 63 y.o. women who was seen today for physical therapy treatment for R shoulder pain. PT assessed LTG for progress not. Significant reduction in disability noted on NDI, no significant improvement in strength, but reports decreased intensity of pain, and improved ability to perform functional movements including lifted weighted boxes and move across room. Patient's condition has the potential to improve in response to therapy. Maximum improvement is yet to be obtained. The anticipated improvement is attainable and reasonable in a generally predictable time.   Pt will benefit from  skilled PT to address painful movements and strength deficits  to improve function, sleep and allow ability to perform all work duties without pain.    OBJECTIVE IMPAIRMENTS: decreased ROM, decreased strength, increased edema, impaired UE functional use, and pain.   ACTIVITY LIMITATIONS: carrying, lifting, sleeping, dressing, reach over head, hygiene/grooming, and caring for others  PARTICIPATION LIMITATIONS: meal prep, cleaning, laundry, shopping, community activity, and occupation  PERSONAL FACTORS: Behavior pattern, Fitness, Past/current experiences, and Profession are also affecting patient's functional outcome.   REHAB POTENTIAL: Good  CLINICAL DECISION MAKING: Stable/uncomplicated  EVALUATION COMPLEXITY: Low  GOALS: Goals reviewed with patient? Yes  SHORT TERM GOALS: Target date: 11/14/2023    Patient will be independent in home exercise program to improve strength/mobility for better functional independence with ADLs. Baseline: provided  8/21 reports completing TE for shoulder strengthening and ROM at least 2 days a week Goal status: INITIAL   LONG TERM GOALS: Target date: 01/09/2024    Patient will increase quick Dash score by equal to or greater than 15points    to demonstrate statistically significant improvement in mobility and quality of life.  Baseline: 70.4% disability  8/21: 24% disability  Goal status: MET  2.  Patient (> 77 years old) will reports no pain with hygiene and dressing tasks  Baseline: 8/10  8/21: reports 7/10 in the morning   Goal status: IN PROGRESS  3.  Patient will increase R shoulder strength to grossly 4+/5 without pain  Baseline: 4 to 4+ with pain  8/21: remains grossly 4/5 to 4+/5 with pain, even through less intense than at eval  Goal status: IN PROGRESS  4.  Patient will be able to carry 10-15# box without pain to perform work functions  Baseline: pain with lifting  8/21: no pain carrying 15 # box througuh PT gym x 148ft  Goal  status: MET  5.  Patient will report sleeping through the night with no pain Baseline: waking every 2-4 hours due to shoulder pain. 8/21: able to sleep through the night most nights. But does have pain when waking most mornings.  Goal status: IN PROGRESS   PLAN:  PT FREQUENCY: 1-2x/week  PT DURATION: 12 weeks  PLANNED INTERVENTIONS: 97164- PT Re-evaluation, 97750- Physical Performance Testing, 97110-Therapeutic exercises, 97530- Therapeutic activity, [id number]- Neuromuscular re-education, 97535- Self Care,  02859- Manual therapy, [id number]- Electrical stimulation (unattended), [id number]- Electrical stimulation (manual), Taping, Joint mobilization, Joint manipulation, Spinal manipulation, Spinal mobilization, Cryotherapy, Moist heat, and therapeutic  Dry needling.   PLAN FOR NEXT SESSION:   Continued Therex for scapular mobility and strengthening.  Manual for TP release.  Possible TDN    Massie FORBES Dollar, PT 11/20/2023, 8:08 AM

## 2023-11-20 NOTE — Assessment & Plan Note (Addendum)
 Lipid Panel     Component Value Date/Time   CHOL 234 (H) 09/29/2023 0944   TRIG 94 09/29/2023 0944   HDL 57 09/29/2023 0944   CHOLHDL 4.1 09/29/2023 0944   LDLCALC 160 (H) 09/29/2023 0944   LABVLDL 17 09/29/2023 0944   The 10-year ASCVD risk score (Arnett DK, et al., 2019) is: 4.5%, low risk encouraged dietary and lifestyle modification including reducing alcohol intake. Repeat lipid panel in 1 year

## 2023-11-20 NOTE — Assessment & Plan Note (Addendum)
 Well controlled currently on lexapro 30 mg daily. PHQ9 is 0 today. Refilled today, continue to monitor mood.

## 2023-11-20 NOTE — Assessment & Plan Note (Addendum)
 Vitamin D  27.3 on 09/29/2023 and recently started D3 1000 units daily for the past 2 months. Will repeat vitamin D  at next visit to assess for improvement.

## 2023-11-20 NOTE — Assessment & Plan Note (Signed)
 History of LOW-GRADE SQUAMOUS INTRAEPITHELIAL LESION (LSIL, VAIN 1) in 2019. Most recent PAP with [facility], HPV-, plan for repeat PAP next year with GYN.

## 2023-11-20 NOTE — Assessment & Plan Note (Signed)
 Discussed increasing physical activity ash she has been sedentary since she is off for summer, works in [employer]. Will be more active once work starts. Discussed healthy diet reducing alcohol intake.

## 2023-11-25 ENCOUNTER — Encounter: Admitting: Physical Therapy

## 2023-11-26 ENCOUNTER — Ambulatory Visit: Payer: 59 | Admitting: Student

## 2023-11-27 ENCOUNTER — Ambulatory Visit: Admitting: Physical Therapy

## 2023-11-27 ENCOUNTER — Encounter: Admitting: Physical Therapy

## 2023-11-27 DIAGNOSIS — M25511 Pain in right shoulder: Secondary | ICD-10-CM

## 2023-11-27 NOTE — Therapy (Signed)
 OUTPATIENT PHYSICAL THERAPY UPPER EXTREMITY TREATMENT     Patient Name: Kayla Washington MRN: [id number] DOB:May 30, 1960, 63 y.o., female Today's Date: 11/27/2023  END OF SESSION:  PT End of Session - 11/27/23 1405     Visit Number 11    Number of Visits 24    Date for PT Re-Evaluation 01/08/24    Progress Note Due on Visit 20    PT Start Time 1402    PT Stop Time 1440    PT Time Calculation (min) 38 min    Activity Tolerance Patient tolerated treatment well    Behavior During Therapy [facility] for tasks assessed/performed            Past Medical History:  Diagnosis Date   Anxiety    Depression    GERD (gastroesophageal reflux disease)    Past Surgical History:  Procedure Laterality Date   ABDOMINAL HYSTERECTOMY  05/2011   Still has left ovary;  Dr. 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   Anxiety and depression 10/11/2014   Alcohol drinker 04/18/2006   Menopausal and perimenopausal disorder 04/18/2006    PCP: Joshua Cathryne BROCKS, MD   REFERRING PROVIDER:    Delinda Jinnie Jansky, CNM    REFERRING DIAG:  Diagnosis  M25.511 (ICD-10-CM) - Right shoulder pain, unspecified chronicity    THERAPY DIAG:  Right shoulder pain, unspecified chronicity  Rationale for Evaluation and Treatment: Rehabilitation  ONSET DATE: >1 month  SUBJECTIVE:                                                                                                                                                                                       SUBJECTIVE STATEMENT:   Pt reports she did well at work today. Did not have to do any heavy lifting but did good overall.   From Eval. Pt reports that she is having pain with IR with reach to pants, and to clasp Bra. States that Pain may have started in end of May to beginning June. Pain is the worst at night.   Hand dominance: Left  PAIN:  Are you having pain? Yes: NPRS scale: state that the pain was not as bad last night. States that pain is only 3/10 at start of PT treatment.   Pain location: R shoulder  Pain description: tight feeling with sharp pain with movement  Aggravating factors: sleep  Relieving factors: pain management. Ice. Feels better with movement throughout the day. Pain patch  PRECAUTIONS: None  RED FLAGS: None   WEIGHT BEARING RESTRICTIONS: No  FALLS:  Has patient fallen in last 6 months? Yes. 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   PLOF: Independent, Independent with basic ADLs, and Independent with gait  PATIENT GOALS: Get out of pain. Work and perform [employer] tasks without pain get a full night sleep.   NEXT MD VISIT: none at this time.   OBJECTIVE:  Note: Objective measures were completed at Evaluation unless otherwise noted.  DIAGNOSTIC FINDINGS:  None relevant     PATIENT SURVEYS :  Quick Dash:  QUICK DASH  Please rate your ability do the following activities in the last week by selecting the number below the appropriate response.  Quick Dash Disability/Symptom Score: [(sum of 45/ responses/11 (n)] x 25 = 70.4  Minimally Clinically Important  Difference (MCID): 15-20 points  (Franchignoni, F. et al. (2013). Minimally clinically important difference of the disabilities of the arm, shoulder, and hand outcome measures (DASH) and its shortened version (Quick DASH). Journal of Orthopaedic & Sports Physical Therapy, 44(1), 30-39)   COGNITION: Overall cognitive status: Within functional limits for tasks assessed and disorganized and verbose      SENSATION: WFL  POSTURE: Rounded shoulders decreased lumbar lordosis   UPPER EXTREMITY ROM:   Active ROM Right eval Left eval  Shoulder flexion WFL*   Shoulder extension    Shoulder abduction WFL*   Shoulder adduction WFL   Shoulder internal rotation [facility]   Shoulder external rotation 55 deg in supine 90 deg   Elbow flexion [facility] WFL  Elbow extension WFL WFL  (Blank rows = not tested)  UPPER EXTREMITY MMT:  MMT Right eval Left eval R 8/21 L 8/21  Shoulder flexion 4 4+ 4 4+  Shoulder extension 4+ 4+ 4+ 4+  Shoulder abduction 4+ * 4+ 4+* 4+  Shoulder adduction 4+ 4+ 4+ 4+  Shoulder internal rotation 4 * 4 4+ 4  Shoulder external rotation 4- * 4 4* 4  Middle trapezius      Lower trapezius      Elbow flexion 4+ 4+ 4+ 4+  Elbow extension 4+ 4+ 4+ 4+  (Blank rows = not tested)  *= pain   SHOULDER SPECIAL TESTS: Impingement tests: Neer impingement test: positive , Hawkins/Kennedy impingement test: positive , and Painful arc test: positive  SLAP lesions: Crank test: negative and Biceps load test: negative Instability tests: Load and shift test: negative and Apprehension test: negative Rotator cuff assessment: Drop arm test: negative, Empty can test: positive , and Hornblower's sign: positive  Biceps assessment: Yergason's test: negative  JOINT MOBILITY TESTING:  Grossly WFL.  Mildly reduced scapular mobility with flexion and abduction.   PALPATION:  Multiple TP noted in UT, supraspinatus, infraspinatus and teres major. Reports mild pain in UT  and infraspinatus in  TREATMENT DATE: 11/27/2023  Therex:   Sci fit x 3 min ea direction level 4   TA- To improve functional movements patterns for everyday tasks   Rows on cable column 3 x 10 low row   Shoulder extensions on cable columns 3 x 10 ea UE with 2.5# ( 7.5# level up caused some discomfort)   TE- To improve strength, endurance, mobility, and function of specific targeted muscle groups or improve joint range of motion or improve muscle flexibility  Seated B shoulder ER 3 x 10 with RTB   Seated ball roll out for shoulder flexion AAROM 10 x 10 sec holds   -x 10 to ea side with lateral ball roll outs   Supine R shoulder seratus punch with 3# AW 2 x 10 reps   Supine R  then L UT stretch with GH depression x 30 sec ea   PATIENT EDUCATION: Education details: POC. Benefits of PT treatment.  Pt educated throughout session about proper posture and technique with exercises. Improved exercise technique, movement at target joints, use of target muscles after min to mod verbal, visual, tactile cues.  Extensive education for Possible benefits and complications of TDN.   Person educated: Patient Education method: Medical illustrator Education comprehension: verbalized understanding and returned demonstration  HOME EXERCISE PROGRAM:  Access Code: [email or web address] URL: [email or web address] Date: 10/21/2023 Prepared by: Massie Dollar  Exercises - Seated Scapular Retraction  - 1 x daily - 5 x weekly - 3 sets - 10 reps - 2 hold - Isometric Shoulder Extension at Wall  - 1 x daily - 5 x weekly - 3 sets - 10 reps - 2 hold - Isometric Shoulder Abduction at Wall  - 1 x daily - 5 x weekly - 3 sets - 10 reps - 2 hold - Seated Shoulder Rolls  - 1 x daily - 5 x weekly - 3 sets - 10 reps - Seated Upper Trapezius Stretch  - 1 x daily - 5 x weekly - 3 sets - 4 reps - 20 hold -  Shoulder External Rotation and Scapular Retraction  - 1 x daily - 5 x weekly - 3 sets - 10 reps - 2 hold  Access Code: [email or web address] URL: [email or web address] Date: 11/20/2023 Prepared by: Massie Dollar  Exercises - Standing Bilateral Low Shoulder Row with Anchored Resistance  - 1 x daily - 5 x weekly - 3 sets - 10 reps - 2sec hold - Shoulder extension with resistance - Neutral  - 1 x daily - 5 x weekly - 3 sets - 10 reps - 2 sec hold - Shoulder External Rotation and Scapular Retraction with Resistance  - 1 x daily - 5 x weekly - 3 sets - 10 reps - 2sec hold - Seated Single Arm Shoulder Flexion  - 1 x daily - 5 x weekly - 3 sets - 10 reps  ASSESSMENT:  CLINICAL IMPRESSION:  Patient is a 63 y.o. women who was seen today for physical therapy treatment for R shoulder pain. Continued with postural strengthening and strengthening of target muscle groups. Pt requires cues for proper form throughout but is eager to learn and complete exercises to further improve her shoulder function. Pt will continue to benefit form PT to address deficits  to improve function, sleep and allow ability to perform all work duties without pain.    OBJECTIVE IMPAIRMENTS: decreased ROM, decreased strength, increased edema, impaired UE functional use, and pain.   ACTIVITY LIMITATIONS: carrying, lifting, sleeping, dressing, reach over head, hygiene/grooming,  and caring for others  PARTICIPATION LIMITATIONS: meal prep, cleaning, laundry, shopping, community activity, and occupation  PERSONAL FACTORS: Behavior pattern, Fitness, Past/current experiences, and Profession are also affecting patient's functional outcome.   REHAB POTENTIAL: Good  CLINICAL DECISION MAKING: Stable/uncomplicated  EVALUATION COMPLEXITY: Low  GOALS: Goals reviewed with patient? Yes  SHORT TERM GOALS: Target date: 11/14/2023    Patient will be independent in home exercise program to improve strength/mobility for better  functional independence with ADLs. Baseline: provided  8/21 reports completing TE for shoulder strengthening and ROM at least 2 days a week Goal status: INITIAL   LONG TERM GOALS: Target date: 01/09/2024    Patient will increase quick Dash score by equal to or greater than 15points    to demonstrate statistically significant improvement in mobility and quality of life.  Baseline: 70.4% disability  8/21: 24% disability  Goal status: MET  2.  Patient (> 45 years old) will reports no pain with hygiene and dressing tasks  Baseline: 8/10  8/21: reports 7/10 in the morning   Goal status: IN PROGRESS  3.  Patient will increase R shoulder strength to grossly 4+/5 without pain  Baseline: 4 to 4+ with pain  8/21: remains grossly 4/5 to 4+/5 with pain, even through less intense than at eval  Goal status: IN PROGRESS  4.  Patient will be able to carry 10-15# box without pain to perform work functions  Baseline: pain with lifting  8/21: no pain carrying 15 # box througuh PT gym x 169ft  Goal status: MET  5.  Patient will report sleeping through the night with no pain Baseline: waking every 2-4 hours due to shoulder pain. 8/21: able to sleep through the night most nights. But does have pain when waking most mornings.  Goal status: IN PROGRESS   PLAN:  PT FREQUENCY: 1-2x/week  PT DURATION: 12 weeks  PLANNED INTERVENTIONS: [id number]- PT Re-evaluation, 97750- Physical Performance Testing, 97110-Therapeutic exercises, 97530- Therapeutic activity, [id number]- Neuromuscular re-education, 97535- Self Care, [id number]- Manual therapy, [id number]- Electrical stimulation (unattended), [phone]- Electrical stimulation (manual), Taping, Joint mobilization, Joint manipulation, Spinal manipulation, Spinal mobilization, Cryotherapy, Moist heat, and therapeutic  Dry needling.   PLAN FOR NEXT SESSION:   Continued Therex for scapular mobility and strengthening.  Manual for TP release.  Possible TDN    Lonni KATHEE Gainer, PT 11/27/2023, 3:08 PM

## 2023-12-04 ENCOUNTER — Encounter: Admitting: Physical Therapy

## 2023-12-09 ENCOUNTER — Encounter: Admitting: Physical Therapy

## 2024-02-13 ENCOUNTER — Other Ambulatory Visit: Payer: Self-pay

## 2024-02-13 DIAGNOSIS — K2100 Gastro-esophageal reflux disease with esophagitis, without bleeding: Secondary | ICD-10-CM

## 2024-02-13 MED ORDER — PANTOPRAZOLE SODIUM 40 MG PO TBEC: 40.0000 mg | DELAYED_RELEASE_TABLET | Freq: Every day | ORAL | 3 refills | Status: AC

## 2024-05-17 ENCOUNTER — Ambulatory Visit: Admitting: Student
